# Patient Record
Sex: Male | Born: 1965
Health system: Southern US, Community
[De-identification: ages and names within clinical notes are randomized; demographics above are authoritative.]

## PROBLEM LIST (undated history)

## (undated) DIAGNOSIS — E119 Type 2 diabetes mellitus without complications: Secondary | ICD-10-CM

## (undated) DIAGNOSIS — E785 Hyperlipidemia, unspecified: Secondary | ICD-10-CM

## (undated) HISTORY — PX: CARPAL TUNNEL RELEASE: SHX101

## (undated) HISTORY — DX: Hyperlipidemia, unspecified: E78.5

## (undated) HISTORY — PX: TONSILLECTOMY: SUR1361

---

## 2005-01-15 ENCOUNTER — Encounter: Payer: Self-pay | Admitting: Family Medicine

## 2007-12-30 ENCOUNTER — Encounter: Payer: Self-pay | Admitting: Family Medicine

## 2009-01-12 ENCOUNTER — Encounter: Payer: Self-pay | Admitting: Cardiology

## 2009-01-12 ENCOUNTER — Ambulatory Visit: Payer: Self-pay | Admitting: Family Medicine

## 2009-01-12 DIAGNOSIS — R0789 Other chest pain: Secondary | ICD-10-CM | POA: Insufficient documentation

## 2009-01-12 DIAGNOSIS — E785 Hyperlipidemia, unspecified: Secondary | ICD-10-CM | POA: Insufficient documentation

## 2009-01-18 ENCOUNTER — Ambulatory Visit: Payer: Self-pay | Admitting: Cardiology

## 2009-02-14 ENCOUNTER — Ambulatory Visit: Payer: Self-pay | Admitting: Cardiology

## 2009-02-22 ENCOUNTER — Ambulatory Visit: Payer: Self-pay | Admitting: Family Medicine

## 2009-02-23 LAB — CONVERTED CEMR LAB
ALT: 24 units/L (ref 0–53)
AST: 25 units/L (ref 0–37)
Albumin: 4.1 g/dL (ref 3.5–5.2)
Alkaline Phosphatase: 62 units/L (ref 39–117)
BUN: 16 mg/dL (ref 6–23)
Bilirubin, Direct: 0.1 mg/dL (ref 0.0–0.3)
CO2: 27 meq/L (ref 19–32)
Calcium: 9.3 mg/dL (ref 8.4–10.5)
Chloride: 108 meq/L (ref 96–112)
Cholesterol: 209 mg/dL — ABNORMAL HIGH (ref 0–200)
Creatinine, Ser: 0.9 mg/dL (ref 0.4–1.5)
Direct LDL: 138 mg/dL
GFR calc non Af Amer: 97.15 mL/min (ref >60)
Glucose, Bld: 98 mg/dL (ref 70–99)
HDL: 34.9 mg/dL — ABNORMAL LOW (ref >39.00)
Potassium: 4.4 meq/L (ref 3.5–5.1)
Sodium: 141 meq/L (ref 135–145)
Total Bilirubin: 1.2 mg/dL (ref 0.3–1.2)
Total CHOL/HDL Ratio: 6
Total Protein: 6.7 g/dL (ref 6.0–8.3)
Triglycerides: 212 mg/dL — ABNORMAL HIGH (ref 0.0–149.0)
VLDL: 42.4 mg/dL — ABNORMAL HIGH (ref 0.0–40.0)

## 2010-02-27 ENCOUNTER — Ambulatory Visit: Payer: Self-pay | Admitting: Family Medicine

## 2010-02-27 DIAGNOSIS — N453 Epididymo-orchitis: Secondary | ICD-10-CM | POA: Insufficient documentation

## 2010-03-21 ENCOUNTER — Telehealth: Payer: Self-pay | Admitting: Family Medicine

## 2010-03-21 ENCOUNTER — Ambulatory Visit: Payer: Self-pay | Admitting: Family Medicine

## 2010-03-22 LAB — CONVERTED CEMR LAB
ALT: 21 units/L (ref 0–53)
AST: 20 units/L (ref 0–37)
Albumin: 4.3 g/dL (ref 3.5–5.2)
Alkaline Phosphatase: 55 units/L (ref 39–117)
BUN: 16 mg/dL (ref 6–23)
Basophils Absolute: 0 10*3/uL (ref 0.0–0.1)
Basophils Relative: 0.6 % (ref 0.0–3.0)
Bilirubin, Direct: 0.1 mg/dL (ref 0.0–0.3)
CO2: 25 meq/L (ref 19–32)
Calcium: 9.1 mg/dL (ref 8.4–10.5)
Chloride: 100 meq/L (ref 96–112)
Cholesterol: 209 mg/dL — ABNORMAL HIGH (ref 0–200)
Creatinine, Ser: 0.9 mg/dL (ref 0.4–1.5)
Direct LDL: 141.3 mg/dL
Eosinophils Absolute: 0.1 10*3/uL (ref 0.0–0.7)
Eosinophils Relative: 1.9 % (ref 0.0–5.0)
GFR calc non Af Amer: 100.54 mL/min (ref >60)
Glucose, Bld: 102 mg/dL — ABNORMAL HIGH (ref 70–99)
HCT: 42.8 % (ref 39.0–52.0)
HDL: 38.1 mg/dL — ABNORMAL LOW (ref >39.00)
Hemoglobin: 14.6 g/dL (ref 13.0–17.0)
Lymphocytes Relative: 43.2 % (ref 12.0–46.0)
Lymphs Abs: 2.4 10*3/uL (ref 0.7–4.0)
MCHC: 34.1 g/dL (ref 30.0–36.0)
MCV: 91.7 fL (ref 78.0–100.0)
Monocytes Absolute: 0.5 10*3/uL (ref 0.1–1.0)
Monocytes Relative: 8.6 % (ref 3.0–12.0)
Neutro Abs: 2.6 10*3/uL (ref 1.4–7.7)
Neutrophils Relative %: 45.7 % (ref 43.0–77.0)
PSA: 0.54 ng/mL (ref 0.10–4.00)
Platelets: 232 10*3/uL (ref 150.0–400.0)
Potassium: 4.5 meq/L (ref 3.5–5.1)
RBC: 4.66 M/uL (ref 4.22–5.81)
RDW: 13.4 % (ref 11.5–14.6)
Sodium: 136 meq/L (ref 135–145)
TSH: 1.11 microintl units/mL (ref 0.35–5.50)
Total Bilirubin: 1 mg/dL (ref 0.3–1.2)
Total CHOL/HDL Ratio: 5
Total Protein: 7.3 g/dL (ref 6.0–8.3)
Triglycerides: 252 mg/dL — ABNORMAL HIGH (ref 0.0–149.0)
VLDL: 50.4 mg/dL — ABNORMAL HIGH (ref 0.0–40.0)
WBC: 5.7 10*3/uL (ref 4.5–10.5)

## 2010-04-13 ENCOUNTER — Encounter: Payer: Self-pay | Admitting: Internal Medicine

## 2010-04-17 ENCOUNTER — Telehealth (INDEPENDENT_AMBULATORY_CARE_PROVIDER_SITE_OTHER): Payer: Self-pay

## 2010-04-19 ENCOUNTER — Ambulatory Visit: Payer: Self-pay | Admitting: Internal Medicine

## 2010-04-19 ENCOUNTER — Ambulatory Visit (HOSPITAL_COMMUNITY): Admission: RE | Admit: 2010-04-19 | Discharge: 2010-04-19 | Payer: Self-pay | Admitting: Internal Medicine

## 2010-04-19 HISTORY — PX: COLONOSCOPY: SHX174

## 2010-09-25 NOTE — Assessment & Plan Note (Signed)
Summary: RECHECK MEDS-CHEST DISCOMFORT////CCM   Vital Signs:  Patient profile:   45 year old male Height:      68.5 inches Weight:      242 pounds BMI:     36.39 Temp:     98.1 degrees F oral BP sitting:   90 / 60  (left arm) Cuff size:   regular  Vitals Entered By: Sid Falcon LPN (Jan 12, 2009 3:58 PM) CC: Previous [place] pt, to establish, recheck meds   History of Present Illness: Patient is seen as a work in with chief complaint of intermittent chest discomfort off and on for several months but worse over the past few weeks. He relates recent increased stressors with his father-in-law dying of metastatic lung cancer. He thinks this has been a precipitating factor. He describes  chest discomfort which is mostly left anterior chest which is mild in severity and without radiation sometimes described as a burning quality and sometimes pressure. This is never related to activity. He has not been exercising regularly. Sometimes symptoms last a few minutes sometimes up to 15 minutes. No associated dyspnea or diaphoresis. No prior cardiac problems or any cardiac workup.  Patient has history of hypertriglyceridemia and has been on gemfibrozil for years. He also takes fish oil. No history of smoking.  Preventive Screening-Counseling & Management     Smoking Status: never  Past History:  Family History:    Family History of Alcoholism/Addiction, parent    Colon cancer, rectal, mother    Lung cancer, rather    Family History High cholesterol, both brothers     (01/12/2009)  Social History:    Occupation:  Emergency planning/management officer    Married    Never Smoked    Alcohol use-no     (01/12/2009)  Risk Factors:    Alcohol Use: N/A    >5 drinks/d w/in last 3 months: N/A    Caffeine Use: N/A    Diet: N/A    Exercise: N/A  Risk Factors:    Smoking Status: never (01/12/2009)    Packs/Day: N/A    Cigars/wk: N/A    Pipe Use/wk: N/A    Cans of tobacco/wk: N/A    Passive Smoke Exposure:  N/A  Past Medical History:    Hyperlipidemia    Migraines  Past Surgical History:    Tonsillectomy, child  Family History:    Family History of Alcoholism/Addiction, parent    Colon cancer, rectal, mother    Lung cancer, rather    Family History High cholesterol, both brothers  Social History:    Occupation:  Emergency planning/management officer    Married    Never Smoked    Alcohol use-no    Occupation:  employed    Smoking Status:  never  Review of Systems  The patient denies anorexia, fever, weight loss, weight gain, syncope, dyspnea on exertion, peripheral edema, prolonged cough, and hemoptysis.    Physical Exam  General:  Well-developed,well-nourished,in no acute distress; alert,appropriate and cooperative throughout examination Neck:  No deformities, masses, or tenderness noted. Chest Wall:  nontender to palpation Lungs:  Normal respiratory effort, chest expands symmetrically. Lungs are clear to auscultation, no crackles or wheezes. Heart:  Normal rate and regular rhythm. S1 and S2 normal without gallop, murmur, click, rub or other extra sounds. Extremities:  no edema noted   Impression & Recommendations:  Problem # 1:  CHEST PAIN, ATYPICAL (ICD-786.59) Symptoms are atypical and patient has moderate risk factors.  ? stress induced symptoms.  EKG today  shows sinus bradycardia but no other acute changes. Patient will be referred to cardiology for further evaluation. Add baby aspirin 81 mg daily. He is advised to avoid heavy exertional activity until further evaluated.  Needs to schedule CPE. Orders: EKG w/ Interpretation (93000) Cardiology Referral (Cardiology)  Complete Medication List: 1)  Gemfibrozil 600 Mg Tabs (Gemfibrozil) .... One tab two times a day 2)  Fish Oil 1000 Mg Caps (Omega-3 fatty acids) .... One tab two times a day  Patient Instructions: 1)  We will call you regarding appointment with cardiologist. Avoid any extreme exertional activities until then. Start baby  aspirin one daily. Followup promptly if you have any worsening symptoms. Schedule complete physical examination.

## 2010-09-25 NOTE — Assessment & Plan Note (Signed)
Summary: cpx-will fasting//check prostate//ccm/pt rsc/cjr   Vital Signs:  Patient profile:   45 year old male Weight:      233 pounds Temp:     98.7 degrees F oral Pulse rate:   60 / minute Pulse rhythm:   regular Resp:     12 per minute BP sitting:   90 / 70  (left arm) Cuff size:   large  Vitals Entered By: Sid Falcon LPN (March 21, 2010 9:01 AM) CC: CPX, pt fasting for labs   History of Present Illness: Patient here for complete physical examination.  Past medical history reviewed. History of dyslipidemia. Takes gemfibrozil 600 mg b.i.d. Also takes omega-3 supplement.  testicular pain from last visit fully resolved. Patient has no known drug allergies. Date of last tetanus unknown.  Family history significant for father with lung cancer. Mother had colon cancer, CAD, and type 2 diabetes. Patient has never had colon screening.  Patient is a Emergency planning/management officer. Nonsmoker. No alcohol use.  Allergies (verified): 1)  ! Augmentin (Amoxicillin-Pot Clavulanate)  Past History:  Past Medical History: Last updated: 01/18/2009 Hyperlipidemia Migraines No DM No hypertension  Past Surgical History: Last updated: 01/12/2009 Tonsillectomy, child  Family History: Last updated: 03/21/2010 Family History of Alcoholism/Addiction, parent Colon cancer, rectal, mother Lung cancer, father Family History High cholesterol, both brothers Mother with stents in her 60s Mother Type 2 DM  Social History: Last updated: 01/12/2009 Occupation:  Emergency planning/management officer Married Never Smoked Alcohol use-no  Risk Factors: Smoking Status: never (01/12/2009) PMH-FH-SH reviewed for relevance  Family History: Family History of Alcoholism/Addiction, parent Colon cancer, rectal, mother Lung cancer, father Family History High cholesterol, both brothers Mother with stents in her 67s Mother Type 2 DM  Review of Systems  The patient denies anorexia, fever, weight loss, weight gain, vision loss,  decreased hearing, hoarseness, chest pain, syncope, dyspnea on exertion, peripheral edema, prolonged cough, headaches, hemoptysis, abdominal pain, melena, hematochezia, severe indigestion/heartburn, hematuria, incontinence, genital sores, muscle weakness, suspicious skin lesions, transient blindness, difficulty walking, depression, unusual weight change, abnormal bleeding, enlarged lymph nodes, and testicular masses.    Physical Exam  General:  Well-developed,well-nourished,in no acute distress; alert,appropriate and cooperative throughout examination Head:  Normocephalic and atraumatic without obvious abnormalities. No apparent alopecia or balding. Eyes:  No corneal or conjunctival inflammation noted. EOMI. Perrla. Funduscopic exam benign, without hemorrhages, exudates or papilledema. Vision grossly normal. Ears:  External ear exam shows no significant lesions or deformities.  Otoscopic examination reveals clear canals, tympanic membranes are intact bilaterally without bulging, retraction, inflammation or discharge. Hearing is grossly normal bilaterally. Mouth:  Oral mucosa and oropharynx without lesions or exudates.  Teeth in good repair. Neck:  No deformities, masses, or tenderness noted. Lungs:  Normal respiratory effort, chest expands symmetrically. Lungs are clear to auscultation, no crackles or wheezes. Heart:  Normal rate and regular rhythm. S1 and S2 normal without gallop, murmur, click, rub or other extra sounds. Abdomen:  Bowel sounds positive,abdomen soft and non-tender without masses, organomegaly or hernias noted. Rectal:  No external abnormalities noted. Normal sphincter tone. No rectal masses or tenderness. Prostate:  Prostate gland firm and smooth, no enlargement, nodularity, tenderness, mass, asymmetry or induration. Msk:  No deformity or scoliosis noted of thoracic or lumbar spine.   Extremities:  No clubbing, cyanosis, edema, or deformity noted with normal full range of motion of  all joints.   Neurologic:  alert & oriented X3, cranial nerves II-XII intact, strength normal in all extremities, and gait normal.  Skin:  no rashes and no suspicious lesions.   Cervical Nodes:  No lymphadenopathy noted Psych:  Cognition and judgment appear intact. Alert and cooperative with normal attention span and concentration. No apparent delusions, illusions, hallucinations   Impression & Recommendations:  Problem # 1:  Preventive Health Care (ICD-V70.0) patient needs colonoscopy screening given family history of colon cancer in his mother. Tetanus booster will be given. Obtain screening labs. Work on weight loss and more consistent exercise  Complete Medication List: 1)  Gemfibrozil 600 Mg Tabs (Gemfibrozil) .... One tab two times a day 2)  Fish Oil 1000 Mg Caps (Omega-3 fatty acids) .... One tab two times a day 3)  Ciprofloxacin Hcl 500 Mg Tabs (Ciprofloxacin hcl) .... One by mouth two times a day for 10 days  Other Orders: TLB-Lipid Panel (80061-LIPID) TLB-BMP (Basic Metabolic Panel-BMET) (80048-METABOL) TLB-CBC Platelet - w/Differential (85025-CBCD) TLB-Hepatic/Liver Function Pnl (80076-HEPATIC) TLB-TSH (Thyroid Stimulating Hormone) (84443-TSH) TLB-PSA (Prostate Specific Antigen) (84153-PSA) Gastroenterology Referral (GI) Tdap => 22yrs IM (16109) Admin 1st Vaccine (60454) Venipuncture (09811) Specimen Handling (91478)  Patient Instructions: 1)  It is important that you exercise reguarly at least 20 minutes 5 times a week. If you develop chest pain, have severe difficulty breathing, or feel very tired, stop exercising immediately and seek medical attention.  2)  You need to lose weight. Consider a lower calorie diet and regular exercise.  Prescriptions: GEMFIBROZIL 600 MG TABS (GEMFIBROZIL) one tab two times a day  #60 x 11   Entered and Authorized by:   Evelena Peat MD   Signed by:   Evelena Peat MD on 03/21/2010   Method used:   [facility]        [facility] [phone][facility] (retail)       [address]       [place], [place]  [address]       Ph: [phone] or [phone]       Fax: [phone]   RxID:   [id number]     Immunizations Administered:  Tetanus Vaccine:    Vaccine Type: Tdap    Site: left deltoid    Mfr: GlaxoSmithKline    Dose: 0.5 ml    Route: IM    Given by: Sid Falcon LPN    Exp. Date: 09/14/2011    Lot #: [id number]

## 2010-09-25 NOTE — Progress Notes (Signed)
Summary: Pt requesting #180 of Gemfibrozil  Phone Note Call from Patient Call back at Home Phone [phone]   Caller: Patient---live call Summary of Call: pt would like for Harriett Sine to return call. No further message was given. Initial call taken by: Warnell Forester,  March 21, 2010 1:05 PM  Follow-up for Phone Call        [facility] on home phone Sid Falcon LPN  March 21, 2010 1:21 PM   Additional Follow-up for Phone Call Additional follow up Details #1::        Pt requesting #180 of med filled earlier today, 3 month supply at a time.  Called pharmacy and authorized 180, pt informed Additional Follow-up by: Sid Falcon LPN,  March 21, 2010 3:29 PM

## 2010-09-25 NOTE — Progress Notes (Signed)
Summary: phone note/advise pt in time change of procedure  Phone Note Outgoing Call   Call placed by: Numair Masden Call placed to: Patient Summary of Call: [facility] that time for procedure has changed due to adding OR pt's. Original time was 11:00 AM. It is now 1:15 pm and he  needs to be there at 12:15 pm. Ask him to please call and confirm that he received the message. Initial call taken by: Cloria Spring LPN,  April 17, 2010 10:59 AM     Appended Document: phone note/advise pt in time change of procedure Pt informed.

## 2010-09-25 NOTE — Assessment & Plan Note (Signed)
Summary: testicle pain/njr   Vital Signs:  Patient profile:   45 year old male Height:      69 inches (175.26 cm) Weight:      237 pounds (107.73 kg) BMI:     35.13 O2 Sat:      97 % on Room air Temp:     98.4 degrees F (36.89 degrees C) oral Pulse rate:   69 / minute BP sitting:   108 / 72  (left arm) Cuff size:   large  Vitals Entered By: Josph Macho RMA (February 27, 2010 11:03 AM)  O2 Flow:  Room air CC: Testicle pain X6 days/ CF Is Patient Diabetic? No   History of Present Illness: 6 days hx of L sided testicle pain.  Burning pain with some nausea.  Moderate intensity. No injury.  No fever.  No urinary symptoms.  Progressively worse over time. Has not noted any masses.  No exacerbating factors.  Slightly better with heat.  Current Medications (verified): 1)  Gemfibrozil 600 Mg Tabs (Gemfibrozil) .... One Tab Two Times A Day 2)  Fish Oil 1000 Mg Caps (Omega-3 Fatty Acids) .... One Tab Two Times A Day  Allergies (verified): No Known Drug Allergies  Past History:  Past Medical History: Last updated: 01/18/2009 Hyperlipidemia Migraines No DM No hypertension  Review of Systems  The patient denies anorexia, fever, weight loss, abdominal pain, hematuria, and incontinence.    Physical Exam  General:  Well-developed,well-nourished,in no acute distress; alert,appropriate and cooperative throughout examination Lungs:  Normal respiratory effort, chest expands symmetrically. Lungs are clear to auscultation, no crackles or wheezes. Heart:  Normal rate and regular rhythm. S1 and S2 normal without gallop, murmur, click, rub or other extra sounds. Genitalia:  no hernia.  Tender over L epididymis gland.  No testicle mass.  No other scrotal masses noted.   Impression & Recommendations:  Problem # 1:  EPIDIDYMITIS (ICD-604.90) Assessment New start Cipro and pt has CPE in 3 weeks scheduled.  Complete Medication List: 1)  Gemfibrozil 600 Mg Tabs (Gemfibrozil) .... One tab  two times a day 2)  Fish Oil 1000 Mg Caps (Omega-3 fatty acids) .... One tab two times a day 3)  Ciprofloxacin Hcl 500 Mg Tabs (Ciprofloxacin hcl) .... One by mouth two times a day for 10 days  Patient Instructions: 1)  Continue Advil or Alleve as needed. 2)  Be in touch if pain not resolving in 2 weeks. Prescriptions: CIPROFLOXACIN HCL 500 MG TABS (CIPROFLOXACIN HCL) one by mouth two times a day for 10 days  #20 x 0   Entered and Authorized by:   Evelena Peat MD   Signed by:   Evelena Peat MD on 02/27/2010   Method used:   Electronically to        [facility] [phone]* (retail)       [address]       [place], [place]  [address]       Ph: [phone] or [phone]       Fax: [phone]   RxID:   [id number]

## 2010-09-25 NOTE — Letter (Signed)
Summary: Internal Other Domingo Dimes  Internal Other Domingo Dimes   Imported By: Cloria Spring LPN 16/05/9603 54:09:81  _____________________________________________________________________  External Attachment:    Type:   Image     Comment:   External Document

## 2010-09-25 NOTE — Assessment & Plan Note (Signed)
Summary: np6/atypical chest pain/kfw   CC:  pt complains of chest pain .  History of Present Illness: 45 yo male with no prior cardiac history who I am asked to evaluate for chest pain. The patient states that for the past one year he has had occasional chest pain. It is in the left upper chest area. It lasts for seconds. It is unpredictable. It is not pleuritic or positional nor is it related to food. It is not exertional. It is not associated with nausea, short of breath or diaphoresis. Note he does not have dyspnea on exertion, orthopnea, PND, pedal edema, palpitations, syncope or exertional chest pain. The patient is a Emergency planning/management officer.  Current Medications (verified): 1)  Gemfibrozil 600 Mg Tabs (Gemfibrozil) .... One Tab Two Times A Day 2)  Fish Oil 1000 Mg Caps (Omega-3 Fatty Acids) .... One Tab Two Times A Day  Past History:  Past Medical History: Hyperlipidemia Migraines No DM No hypertension  Past Surgical History: Reviewed history from 01/12/2009 and no changes required. Tonsillectomy, child  Family History: Reviewed history from 01/12/2009 and no changes required. Family History of Alcoholism/Addiction, parent Colon cancer, rectal, mother Lung cancer, father Family History High cholesterol, both brothers Mother with stents in her 94s  Social History: Reviewed history from 01/12/2009 and no changes required. Occupation:  Emergency planning/management officer Married Never Smoked Alcohol use-no  Review of Systems       no fevers or chills, productive cough, hemoptysis, dysphasia, odynophagia, melena, hematochezia, dysuria, hematuria, rash, seizure activity, orthopnea, PND, pedal edema, claudication. Remaining systems are negative.   Vital Signs:  Patient profile:   45 year old male Height:      69 inches Weight:      238 pounds Pulse rate:   72 / minute BP sitting:   101 / 74  (left arm)  Vitals Entered By: Kem Parkinson (Jan 18, 2009 2:35 PM)  Physical Exam  General:   Well developed/well nourished in NAD Skin warm/dry Patient not depressed No peripheral clubbing Back-normal HEENT-normal/normal eyelids Neck supple/normal carotid upstroke bilaterally; no bruits; no JVD; no thyromegaly chest - CTA/ normal expansion CV - RRR/normal S1 and S2; no murmurs, rubs or gallops; no rub; PMI nondisplaced Abdomen -NT/ND, no HSM, no mass, + bowel sounds, no bruit 2+ femoral pulses, no bruits Ext-no edema, chords, 2+ DP Neuro-grossly nonfocal     EKG  Procedure date:  01/12/2009  Findings:      Sinus rhythm at a rate of 51. No ST changes.  Impression & Recommendations:  Problem # 1:  CHEST PAIN, ATYPICAL (ICD-786.59) Symptoms are extremely atypical. However he is concerned about these. He also is a Emergency planning/management officer and needs risk stratification. We will schedule an exercise treadmill. Orders: Treadmill (Treadmill)  Problem # 2:  HYPERLIPIDEMIA (ICD-272.4) Management per primary care. His updated medication list for this problem includes:    Gemfibrozil 600 Mg Tabs (Gemfibrozil) ..... One tab two times a day  Patient Instructions: 1)  Your physician recommends that you schedule a follow-up appointment in: TREADMILL 2)  Your physician has requested that you have an exercise tolerance test.  For further information please visit [email or web address]  Please also follow instruction sheet, as given.

## 2010-09-25 NOTE — Assessment & Plan Note (Signed)
Summary: med check/mhf   Vital Signs:  Patient profile:   45 year old male Weight:      237 pounds Temp:     97.9 degrees F oral Pulse rate:   60 / minute Pulse rhythm:   regular BP sitting:   110 / 70  Vitals Entered By: Sid Falcon LPN (February 22, 2009 10:03 AM) CC: Pt fasting for labs, one month F/U   History of Present Illness: Patient seen for followup dyslipidemia. Recent atypical chest pain an exercise tolerance test was normal. He had no further chest pain since then. Patient has been on gemfibrozil 600 mg b.i.d. for several years. He had intolerance to TriCor with severe myalgias of lower extremities. He has been compliant with medication. He also takes omega-3 2000 mg daily. Patient exercises though somewhat inconsistently. Family history significant for mother with type 2 diabetes and sister passed away from constipation as a probable type 1 diabetes.  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 Lungs:  Normal respiratory effort, chest expands symmetrically. Lungs are clear to auscultation, no crackles or wheezes. Heart:  Normal rate and regular rhythm. S1 and S2 normal without gallop, murmur, click, rub or other extra sounds.   Impression & Recommendations:  Problem # 1:  HYPERLIPIDEMIA (ICD-272.4)  patient has predominantly hypertriglyceridemia.  Continue weight loss efforts and recheck lipids today along with hepatic panel. Refill gemfibrozil for one year. He needs to get at least yearly monitoring of blood sugar given significant family history His updated medication list for this problem includes:    Gemfibrozil 600 Mg Tabs (Gemfibrozil) ..... One tab two times a day  Orders: Venipuncture (91478) TLB-Lipid Panel (80061-LIPID) TLB-Hepatic/Liver Function Pnl (80076-HEPATIC) TLB-BMP (Basic Metabolic Panel-BMET) (80048-METABOL)  Complete Medication List: 1)  Gemfibrozil 600 Mg Tabs (Gemfibrozil) .... One tab two times a day 2)  Fish Oil 1000 Mg Caps (Omega-3 fatty acids) .... One tab two times a day  Patient Instructions: 1)  It is important that you exercise reguarly at least 20 minutes 5 times a week. If you develop chest pain, have severe difficulty breathing, or feel very tired, stop exercising immediately and seek medical attention.  Prescriptions: GEMFIBROZIL 600 MG TABS (GEMFIBROZIL) one tab two times a day  #180 x 3   Entered and Authorized by:   Evelena Peat MD   Signed by:   Evelena Peat MD on 02/22/2009   Method used:   [facility] to        [facility] [phone]* (retail)       [address]       [place], [place]  [address]       Ph: [phone] or [phone]       Fax: [phone]   RxID:   [id number]

## 2010-09-25 NOTE — Assessment & Plan Note (Signed)
Summary: GXT WITH Andrew Shields/786.59/SAF   Exercise Tolerance Test Cardiovascular Risk History:      Positive major cardiovascular risk factors include hyperlipidemia.  Negative major cardiovascular risk factors include male age < 47 years old, no history of diabetes, no history of hypertension, negative family history for ischemic heart disease, and non-tobacco-user status.        Further assessment for target organ damage reveals no history of ASHD, cardiac end-organ damage (CHF/LVH), stroke/TIA, peripheral vascular disease, renal insufficiency, or hypertensive retinopathy.    Baseline EKG:    Rhythm:     normal sinus    Rate:       62    PR:       .17    QRS:       .08    QT:       .41    QTc:       .41  Exercise Tolerance Test Results:    Ordering MD:        Dr. Olga Millers    Interpreting MD:     Dr. Olga Millers    Indication for ETT:     chest pain-rule out ischemia    Stress Modality:     exercise-treadmill    Cardiac Imaging Performed:   none    Protocol:       Standard Bruce-maximal    Maximum BP:        142 / 58    MPHR (bpm):        176    85% MPHR (bpm):     150    MHR obtained (bpm):        160    Reached 85% MPHR       (min:sec):       8:10    Total Exercise Time       (min:sec):       10:00    Workload in METS:     11.9    Borg Scale:       13    ST Segment analysis:       At Rest:       normal ST segments-no evidence of significant ST depression       With Exercise:     no evidence of significant ST depression    Arrhythmia:             no    Angina during ETT:     absent (0)  Cardiovascular Risk Assessment/Plan:      The patient's hypertensive risk group is category B: At least one risk factor (excluding diabetes) with no target organ damage.  Today's blood pressure is 102/79.  His blood pressure goal is < 140/90.  Exercise Tolerance Test Assessment:    Quality of ETT:   diagnostic    ETT Interpretation:   normal-no evidence of ischemia by ST analysis   Comments:     ETT with no chest pain or ECG changes; negative adequate ETT.

## 2010-10-03 ENCOUNTER — Ambulatory Visit (HOSPITAL_BASED_OUTPATIENT_CLINIC_OR_DEPARTMENT_OTHER)
Admission: RE | Admit: 2010-10-03 | Discharge: 2010-10-03 | Disposition: A | Discharge status: Home / Self Care | Payer: Worker's Compensation | Attending: Orthopedic Surgery | Admitting: Orthopedic Surgery

## 2010-10-03 DIAGNOSIS — G5600 Carpal tunnel syndrome, unspecified upper limb: Secondary | ICD-10-CM | POA: Insufficient documentation

## 2010-10-03 DIAGNOSIS — Z01812 Encounter for preprocedural laboratory examination: Secondary | ICD-10-CM | POA: Insufficient documentation

## 2010-10-03 DIAGNOSIS — M65839 Other synovitis and tenosynovitis, unspecified forearm: Secondary | ICD-10-CM | POA: Insufficient documentation

## 2010-10-03 LAB — POCT HEMOGLOBIN-HEMACUE: Hemoglobin: 15.8 g/dL (ref 13.0–17.0)

## 2010-10-15 NOTE — Op Note (Signed)
Andrew Shields, Andrew Shields              ACCOUNT NO.:  [id number]  MEDICAL RECORD NO.:  [id number]           PATIENT TYPE:  LOCATION:                                 FACILITY:  PHYSICIAN:  Cindee Salt, M.D.       DATE OF BIRTH:  Jun 27, 1966  DATE OF PROCEDURE:  10/03/2010 DATE OF DISCHARGE:                              OPERATIVE REPORT   PREOPERATIVE DIAGNOSES:  Carpal tunnel syndrome, right hand; stenosing tenosynovitis, right middle finger.  POSTOPERATIVE DIAGNOSES:  Carpal tunnel syndrome, right hand; stenosing tenosynovitis, right middle finger.  OPERATION:  Release A1 pulley, right middle finger; carpal tunnel release, right hand.  SURGEON:  Cindee Salt, MD.  ANESTHESIA:  General with local infiltration.  ANESTHESIOLOGIST:  Janetta Hora. Gelene Mink, MD.  HISTORY:  The patient is a 45 year old male with a history of carpal tunnel syndrome, EMG nerve conductions positive.  This has not responded to conservative treatment.  He is elected to undergo surgical decompression.  Pre, peri, and postoperative course have been discussed along with risks and complications.  He is aware that there is no guarantee with the surgery, possibility of infection, recurrence of injury to arteries, nerves, and tendons,complete relief of symptoms, and dystrophy.  In the preoperative area, the patient is seen.  The extremity was marked by both the patient and surgeon.  Antibiotic given.  PROCEDURE:  The patient was brought to the operating room where general anesthetic was carried out without difficulty, was prepped using ChloraPrep, supine position, with a right arm free.  A 3-minute dry time was allowed, time-out taken, confirming the patient and procedure.  The limb was exsanguinated with an Esmarch bandage.  Tourniquet placed high and the arm was inflated to 220 mmHg.  Straight incision was made longitudinally and the palm carried down through subcutaneous tissue. Bleeders were electrocauterized.   Palmar fascia was split.  Superficial palmar arch identified.  The flexor tendon to the ring little finger identified to the ulnar side of the median nerve.  Carpal retinaculum was incised with sharp dissection.  A right-angle and Sewall retractor were placed between skin and forearm fascia.  The fascia released for approximately a centimeter and a half proximal to the wrist crease under direct vision.  Canal was explored.  Air compression to the nerve was apparent.  No further lesions were identified.  The wound was irrigated. Skin closed with interrupted 5-0 Vicryl Rapide sutures.  Separate incision was then made over the A1 pulley of the right middle finger carried down through subcutaneous tissue.  Bleeders again electrocauterized with bipolar.  The neurovascular bundles were identified and retracted proximally and ulnarly.  The A1 pulley was then identified on its radial aspect and incision was made.  A small incision made centrally in the A2 pulley.  The finger placed through a full range motion.  No further triggering was noted.  The wound was copiously irrigated with saline.  The skin closed with interrupted 5-0 Vicryl Rapide sutures.  Local infiltration was given.  A 0.25% Marcaine without epinephrine approximately 7 mL total was given.  The patient tolerated the procedure well.  A sterile compressive  dressing and splint to the wrist with the fingers free was applied. Deflation of the tourniquet, all fingers immediately pinked.  He was taken to the recovery room for observation in satisfactory condition. He will be discharged home to return to the [facility] of [place] in 1 week, on Vicodin.    ______________________________ Cindee Salt, M.D.   ______________________________ Cindee Salt, M.D.    GK/MEDQ  D:  10/03/2010  T:  10/04/2010  Job:  [id number]  Electronically Signed by Cindee Salt M.D. on 10/15/2010 02:25:38 PM

## 2010-12-10 ENCOUNTER — Encounter: Payer: Self-pay | Admitting: Family Medicine

## 2010-12-10 ENCOUNTER — Ambulatory Visit (INDEPENDENT_AMBULATORY_CARE_PROVIDER_SITE_OTHER): Payer: Worker's Compensation | Admitting: Family Medicine

## 2010-12-10 ENCOUNTER — Ambulatory Visit (INDEPENDENT_AMBULATORY_CARE_PROVIDER_SITE_OTHER)
Admission: RE | Admit: 2010-12-10 | Discharge: 2010-12-10 | Disposition: A | Discharge status: Home / Self Care | Payer: PRIVATE HEALTH INSURANCE | Source: Ambulatory Visit | Attending: Family Medicine | Admitting: Family Medicine

## 2010-12-10 DIAGNOSIS — M545 Low back pain, unspecified: Secondary | ICD-10-CM

## 2010-12-10 DIAGNOSIS — M25579 Pain in unspecified ankle and joints of unspecified foot: Secondary | ICD-10-CM

## 2010-12-10 MED ORDER — HYDROCODONE-ACETAMINOPHEN 5-325 MG PO TABS: 2.0000 | ORAL_TABLET | Freq: Four times a day (QID) | ORAL | Status: AC | PRN

## 2010-12-10 NOTE — Progress Notes (Signed)
Addended by: Trinna Post on: 12/10/2010 06:31 PM   Modules accepted: Orders

## 2010-12-10 NOTE — Progress Notes (Addendum)
  Subjective:    Patient ID: Andrew Shields, male    DOB: 24-Mar-1966, 45 y.o.   MRN: [id number]  HPI Status post fall from ladder this past Saturday. He is not sure exactly how he landed but he does recall hyperextending his back. Now has left ankle pain and swelling which is somewhat poorly localized and sharp pain exacerbated by deep breathing lower lumbar and sacral area. No visible bruising of the back region.  No radiculopathy symptoms.  No lower extremity weakness.  Back pain as sharp without radiation. Leftover hydrocodone helped. Minimal relief with Advil. He did apply some ice to left ankle. Some pain with ambulation but was able to ambulate immediately afterwards. No head injury. No dizziness or confusion.   Review of Systems  HENT: Negative for neck pain.   Respiratory: Negative for cough and shortness of breath.   Cardiovascular: Negative for chest pain and palpitations.  Gastrointestinal: Negative for abdominal pain and blood in stool.  Genitourinary: Negative for dysuria, hematuria and flank pain.  Musculoskeletal: Positive for back pain. Negative for myalgias.  Psychiatric/Behavioral: Negative for confusion.       Objective:   Physical Exam  Constitutional: He is oriented to person, place, and time. He appears well-developed and well-nourished. No distress.  Cardiovascular: Normal rate, regular rhythm and normal heart sounds.   Pulmonary/Chest: Effort normal and breath sounds normal. No respiratory distress. He has no wheezes. He has no rales.  Musculoskeletal:       Left ankle reveals some diffuse edema. Ecchymosis lateral and medial aspect of the foot. He has tenderness both lateral and medial aspect of ankle no navicular tenderness. Pain with extreme dorsiflexion and plantarflexion. No fibular tenderness  Lower back exam reveals no ecchymosis or swelling. Tender lower lumbar area.  Straight leg raises negative  Neurological: He is alert and oriented to person, place,  and time. He has normal reflexes. No cranial nerve deficit.  Psychiatric: He has a normal mood and affect.          Assessment & Plan:  #1 left ankle pain following fall. X-ray to rule out fracture #2 low back pain following fall. Given history of trauma lumbosacral spine films. If negative continue anti-inflammatory and prescription for hydrocodone #40 tablets one to 2 every 6 hours as needed  Ankle x-rays were negative. Lumbosacral spine films reveal irregularity anterior superior aspect of L1 and L2 concerning for possible fracture. May need MRI to further clarify. We'll schedule followup with neurosurgeon though we do not dissipate this will be become unstable type fracture and he has no focal neurologic symptoms

## 2010-12-11 ENCOUNTER — Other Ambulatory Visit: Payer: Self-pay | Admitting: Family Medicine

## 2010-12-11 DIAGNOSIS — M549 Dorsalgia, unspecified: Secondary | ICD-10-CM

## 2010-12-11 NOTE — Progress Notes (Signed)
Addended by: Trinna Post on: 12/11/2010 07:39 PM   Modules accepted: Orders

## 2010-12-12 ENCOUNTER — Ambulatory Visit
Admission: RE | Admit: 2010-12-12 | Discharge: 2010-12-12 | Disposition: A | Discharge status: Home / Self Care | Payer: PRIVATE HEALTH INSURANCE | Source: Ambulatory Visit | Attending: Family Medicine | Admitting: Family Medicine

## 2010-12-12 DIAGNOSIS — M545 Low back pain, unspecified: Secondary | ICD-10-CM

## 2010-12-12 NOTE — Progress Notes (Signed)
Quick Note:  Pt informed and he voiced his understanding ______ 

## 2010-12-13 ENCOUNTER — Telehealth: Payer: Self-pay | Admitting: Family Medicine

## 2010-12-13 NOTE — Telephone Encounter (Signed)
Pt has made an appt for next Wednesday,  to follow up on back pain. However, he would like to know if it ok to mow his yard this weekend? Also, his employer wants to know when can he return to work?

## 2010-12-16 NOTE — Telephone Encounter (Signed)
I would advise very light physical activity for now with no lifting or bending at waist.  He could return to work in 1-2 weeks but I want to reassess first.  He will have to return to light duty which I will discuss with him at follow up.

## 2010-12-19 ENCOUNTER — Ambulatory Visit (INDEPENDENT_AMBULATORY_CARE_PROVIDER_SITE_OTHER): Payer: PRIVATE HEALTH INSURANCE | Admitting: Family Medicine

## 2010-12-19 ENCOUNTER — Encounter: Payer: Self-pay | Admitting: Family Medicine

## 2010-12-19 VITALS — BP 100/78 | Temp 98.8°F

## 2010-12-19 DIAGNOSIS — S32009A Unspecified fracture of unspecified lumbar vertebra, initial encounter for closed fracture: Secondary | ICD-10-CM

## 2010-12-19 NOTE — Progress Notes (Signed)
  Subjective:    Patient ID: Andrew Shields, male    DOB: November 01, 1965, 45 y.o.   MRN: [id number]  HPI Patient seen for followup recent fall. He fell off a ladder with injury to low back. X-rays reveal probable fracture anterior endplate of L1 and L2. This was confirmed by MRI scan. No concern for pathologic fracture. Patient is significantly improved at this time. Rarely taking hydrocodone. Still occasional night pain. Overall greatly improved. No weakness or numbness. Left ankle is greatly improved as well. Swelling hasresolved. No pain with weightbearing. He has remained out of work since his injury.   Review of Systems  Respiratory: Negative for chest tightness.   Genitourinary: Negative for dysuria.  Musculoskeletal: Negative for arthralgias.  Neurological: Negative for dizziness, weakness, light-headedness and headaches.       Objective:   Physical Exam  Constitutional: He appears well-developed and well-nourished.  HENT:  Head: Normocephalic and atraumatic.  Cardiovascular: Normal rate and regular rhythm.   Pulmonary/Chest: Effort normal and breath sounds normal. No respiratory distress. He has no wheezes.  Musculoskeletal: He exhibits no edema and no tenderness.       Back reveals good range of motion. No spinal tenderness.  Neurological:       No weakness lower extremities. Symmetric reflexes          Assessment & Plan:  Fracture or L1 and L2 anterior endplate with no complications. Clinically improving. No bending or lifting for the next 2-3 weeks then gradually resume activities as tolerated

## 2010-12-19 NOTE — Patient Instructions (Signed)
No lifting over 20 pounds for the next 2-3 weeks.

## 2011-06-24 ENCOUNTER — Ambulatory Visit (INDEPENDENT_AMBULATORY_CARE_PROVIDER_SITE_OTHER): Payer: PRIVATE HEALTH INSURANCE | Admitting: Family Medicine

## 2011-06-24 ENCOUNTER — Encounter: Payer: Self-pay | Admitting: Family Medicine

## 2011-06-24 VITALS — BP 100/68 | Temp 98.3°F | Wt 232.0 lb

## 2011-06-24 DIAGNOSIS — E785 Hyperlipidemia, unspecified: Secondary | ICD-10-CM

## 2011-06-24 DIAGNOSIS — R42 Dizziness and giddiness: Secondary | ICD-10-CM

## 2011-06-24 LAB — BASIC METABOLIC PANEL
BUN: 13 mg/dL (ref 6–23)
CO2: 25 mEq/L (ref 19–32)
Calcium: 9.1 mg/dL (ref 8.4–10.5)
Chloride: 104 mEq/L (ref 96–112)
Creatinine, Ser: 0.9 mg/dL (ref 0.4–1.5)
GFR: 96.15 mL/min (ref >60.00)
Glucose, Bld: 82 mg/dL (ref 70–99)
Potassium: 4.2 mEq/L (ref 3.5–5.1)
Sodium: 138 mEq/L (ref 135–145)

## 2011-06-24 LAB — HEPATIC FUNCTION PANEL
ALT: 22 U/L (ref 0–53)
AST: 17 U/L (ref 0–37)
Albumin: 4.1 g/dL (ref 3.5–5.2)
Alkaline Phosphatase: 57 U/L (ref 39–117)
Bilirubin, Direct: 0 mg/dL (ref 0.0–0.3)
Total Bilirubin: 0.6 mg/dL (ref 0.3–1.2)
Total Protein: 7.2 g/dL (ref 6.0–8.3)

## 2011-06-24 LAB — LIPID PANEL
Cholesterol: 208 mg/dL — ABNORMAL HIGH (ref 0–200)
HDL: 35.7 mg/dL — ABNORMAL LOW (ref >39.00)
Total CHOL/HDL Ratio: 6
Triglycerides: 292 mg/dL — ABNORMAL HIGH (ref 0.0–149.0)
VLDL: 58.4 mg/dL — ABNORMAL HIGH (ref 0.0–40.0)

## 2011-06-24 LAB — GLUCOSE, POCT (MANUAL RESULT ENTRY): POC Glucose: 91

## 2011-06-24 LAB — LDL CHOLESTEROL, DIRECT: Direct LDL: 142.2 mg/dL

## 2011-06-24 MED ORDER — GEMFIBROZIL 600 MG PO TABS: 600.0000 mg | ORAL_TABLET | Freq: Two times a day (BID) | ORAL | Status: DC

## 2011-06-24 NOTE — Patient Instructions (Signed)
Drink more fluids and liberalize salt intake somewhat

## 2011-06-24 NOTE — Progress Notes (Signed)
  Subjective:    Patient ID: Andrew Shields, male    DOB: 15-Apr-1966, 45 y.o.   MRN: [id number]  HPI  Medical follow. Long history of dyslipidemia. Patient has previously been intolerant of statins and also intolerant of fenofibrate. He's been on Lopid for several years. Takes twice daily. No side effects.  Recent issues with some dizziness. Occasionally with standing. Symptoms are inconsistent. No syncope. Denies vertigo.  Patient denies any history of N,V, diarrhea. No exertional chest pain. Drinks a fair amount of caffeine. No history of anemia. He does not take any medications other than Lopid and omega-3 supplement  Past Medical History  Diagnosis Date  . Hyperlipidemia    Past Surgical History  Procedure Date  . Tonsillectomy     reports that he has never smoked. He does not have any smokeless tobacco history on file. His alcohol and drug histories not on file. family history includes Alcohol abuse in his father; Cancer in his father and mother; Diabetes in his mother; Heart disease (age of onset:60) in his mother; and Hyperlipidemia in his brother. Allergies  Allergen Reactions  . NWG:NFAOZHYQMVH+QIONGEXBM+WUXLKGMWNU Acid+Aspartame     REACTION: difficulty breathing      Review of Systems  Constitutional: Negative for fever, chills, appetite change and unexpected weight change.  Respiratory: Negative for cough and shortness of breath.   Cardiovascular: Negative for chest pain, palpitations and leg swelling.  Gastrointestinal: Negative for abdominal pain.  Genitourinary: Negative for dysuria.  Neurological: Positive for dizziness. Negative for syncope, weakness and headaches.       Objective:   Physical Exam  Constitutional: He is oriented to person, place, and time. He appears well-developed and well-nourished.  Neck: Neck supple. No thyromegaly present.  Cardiovascular: Normal rate, regular rhythm and normal heart sounds.   No murmur heard. Pulmonary/Chest: Effort  normal and breath sounds normal. No respiratory distress. He has no wheezes. He has no rales.  Musculoskeletal: He exhibits no edema.  Neurological: He is alert and oriented to person, place, and time. No cranial nerve deficit.  Skin: No rash noted.  Psychiatric: He has a normal mood and affect. His behavior is normal.          Assessment & Plan:  #1 dyslipidemia. Refill Lopid. Check lipid and hepatic panel #2 dizziness. By description this sounds like possible relative low blood pressure. No orthostatic change today. Liberalize sodium intake somewhat and increase non-caffeinated beverages.

## 2011-06-25 NOTE — Progress Notes (Signed)
Quick Note:  Pt informed on home VM ______ 

## 2012-06-24 ENCOUNTER — Other Ambulatory Visit: Payer: Self-pay | Admitting: Family Medicine

## 2012-06-30 ENCOUNTER — Other Ambulatory Visit: Payer: Self-pay | Admitting: *Deleted

## 2012-06-30 NOTE — Telephone Encounter (Signed)
Request to fill 180 denied Gemfibrozil, pt needs OV

## 2012-08-15 ENCOUNTER — Other Ambulatory Visit: Payer: Self-pay | Admitting: Family Medicine

## 2012-08-28 ENCOUNTER — Ambulatory Visit (INDEPENDENT_AMBULATORY_CARE_PROVIDER_SITE_OTHER): Payer: PRIVATE HEALTH INSURANCE | Admitting: Family Medicine

## 2012-08-28 ENCOUNTER — Other Ambulatory Visit (INDEPENDENT_AMBULATORY_CARE_PROVIDER_SITE_OTHER): Payer: PRIVATE HEALTH INSURANCE | Admitting: Family Medicine

## 2012-08-28 VITALS — BP 100/68 | HR 68 | Temp 97.7°F

## 2012-08-28 VITALS — BP 100/68 | Temp 97.8°F

## 2012-08-28 DIAGNOSIS — Z0000 Encounter for general adult medical examination without abnormal findings: Secondary | ICD-10-CM

## 2012-08-28 DIAGNOSIS — J019 Acute sinusitis, unspecified: Secondary | ICD-10-CM

## 2012-08-28 LAB — CBC WITH DIFFERENTIAL/PLATELET
Basophils Absolute: 0 10*3/uL (ref 0.0–0.1)
Basophils Relative: 0.3 % (ref 0.0–3.0)
Eosinophils Absolute: 0.1 10*3/uL (ref 0.0–0.7)
Eosinophils Relative: 1.3 % (ref 0.0–5.0)
HCT: 42.3 % (ref 39.0–52.0)
Hemoglobin: 14.1 g/dL (ref 13.0–17.0)
Lymphocytes Relative: 31.4 % (ref 12.0–46.0)
Lymphs Abs: 3.1 10*3/uL (ref 0.7–4.0)
MCHC: 33.3 g/dL (ref 30.0–36.0)
MCV: 90.9 fl (ref 78.0–100.0)
Monocytes Absolute: 0.8 10*3/uL (ref 0.1–1.0)
Monocytes Relative: 8 % (ref 3.0–12.0)
Neutro Abs: 5.7 10*3/uL (ref 1.4–7.7)
Neutrophils Relative %: 59 % (ref 43.0–77.0)
Platelets: 227 10*3/uL (ref 150.0–400.0)
RBC: 4.65 Mil/uL (ref 4.22–5.81)
RDW: 13.8 % (ref 11.5–14.6)
WBC: 9.7 10*3/uL (ref 4.5–10.5)

## 2012-08-28 LAB — POCT URINALYSIS DIPSTICK
Bilirubin, UA: NEGATIVE
Glucose, UA: NEGATIVE
Ketones, UA: NEGATIVE
Leukocytes, UA: NEGATIVE
Nitrite, UA: NEGATIVE
Protein, UA: NEGATIVE
Spec Grav, UA: >=1.03
Urobilinogen, UA: 0.2
pH, UA: 6

## 2012-08-28 LAB — LIPID PANEL
Cholesterol: 192 mg/dL (ref 0–200)
HDL: 36.9 mg/dL — ABNORMAL LOW
LDL Cholesterol: 128 mg/dL — ABNORMAL HIGH (ref 0–99)
Total CHOL/HDL Ratio: 5
Triglycerides: 138 mg/dL (ref 0.0–149.0)
VLDL: 27.6 mg/dL (ref 0.0–40.0)

## 2012-08-28 LAB — TSH: TSH: 0.63 u[IU]/mL (ref 0.35–5.50)

## 2012-08-28 LAB — HEPATIC FUNCTION PANEL
ALT: 17 U/L (ref 0–53)
AST: 20 U/L (ref 0–37)
Albumin: 4.2 g/dL (ref 3.5–5.2)
Alkaline Phosphatase: 55 U/L (ref 39–117)
Bilirubin, Direct: 0 mg/dL (ref 0.0–0.3)
Total Bilirubin: 0.8 mg/dL (ref 0.3–1.2)
Total Protein: 7.5 g/dL (ref 6.0–8.3)

## 2012-08-28 LAB — BASIC METABOLIC PANEL WITH GFR
BUN: 14 mg/dL (ref 6–23)
CO2: 25 meq/L (ref 19–32)
Calcium: 9.1 mg/dL (ref 8.4–10.5)
Chloride: 107 meq/L (ref 96–112)
Creatinine, Ser: 0.9 mg/dL (ref 0.4–1.5)
GFR: 93.27 mL/min
Glucose, Bld: 97 mg/dL (ref 70–99)
Potassium: 4 meq/L (ref 3.5–5.1)
Sodium: 139 meq/L (ref 135–145)

## 2012-08-28 MED ORDER — AZITHROMYCIN 250 MG PO TABS: ORAL_TABLET | ORAL | Status: DC

## 2012-08-28 NOTE — Progress Notes (Signed)
  Subjective:    Patient ID: Andrew Shields, male    DOB: Nov 03, 1965, 47 y.o.   MRN: [id number]  HPI Acute visit. Patient developed on New Year's Eve body aches, sore throat, laryngitis, and productive cough. Had progression facial pain since then and purulent nasal secretions. Intermittent mild headache. No nausea or vomiting. Mucinex and nasal saline irrigation without much improvement   Review of Systems  Constitutional: Negative for fever, chills and fatigue.  HENT: Positive for congestion and sinus pressure.   Respiratory: Positive for cough. Negative for shortness of breath and wheezing.        Objective:   Physical Exam  Constitutional: He appears well-developed and well-nourished.  HENT:  Right Ear: External ear normal.  Left Ear: External ear normal.       Patient has thick yellow mucus posterior pharynx and erythema involving uvula. No exudate  Neck: Neck supple. No thyromegaly present.  Cardiovascular: Normal rate and regular rhythm.   Pulmonary/Chest: Effort normal and breath sounds normal. No respiratory distress. He has no wheezes. He has no rales.          Assessment & Plan:  Pharyngitis. Probable acute sinusitis. Allergy to penicillin. Zithromax for 5 days. Rapid strep negative.

## 2012-08-28 NOTE — Progress Notes (Unsigned)
  Subjective:    Patient ID: Andrew Shields, male    DOB: [birth year], 47 y.o.   MRN: [id number]  HPI    Review of Systems     Objective:   Physical Exam        Assessment & Plan:

## 2012-09-02 ENCOUNTER — Ambulatory Visit (INDEPENDENT_AMBULATORY_CARE_PROVIDER_SITE_OTHER): Payer: PRIVATE HEALTH INSURANCE | Admitting: Family Medicine

## 2012-09-02 ENCOUNTER — Encounter: Payer: Self-pay | Admitting: Family Medicine

## 2012-09-02 VITALS — BP 110/72 | HR 72 | Temp 97.3°F | Resp 12 | Ht 67.0 in | Wt 235.0 lb

## 2012-09-02 DIAGNOSIS — Z0000 Encounter for general adult medical examination without abnormal findings: Secondary | ICD-10-CM

## 2012-09-02 DIAGNOSIS — Z23 Encounter for immunization: Secondary | ICD-10-CM

## 2012-09-02 LAB — POCT URINALYSIS DIPSTICK
Bilirubin, UA: NEGATIVE
Blood, UA: NEGATIVE
Glucose, UA: NEGATIVE
Ketones, UA: NEGATIVE
Leukocytes, UA: NEGATIVE
Nitrite, UA: NEGATIVE
Protein, UA: NEGATIVE
Spec Grav, UA: >=1.03
Urobilinogen, UA: 0.2
pH, UA: 6

## 2012-09-02 MED ORDER — GEMFIBROZIL 600 MG PO TABS: 600.0000 mg | ORAL_TABLET | Freq: Two times a day (BID) | ORAL | Status: DC

## 2012-09-02 NOTE — Patient Instructions (Addendum)
Work on trying to lose some weight

## 2012-09-02 NOTE — Progress Notes (Signed)
  Subjective:    Patient ID: Andrew Shields, male    DOB: Nov 06, 1965, 47 y.o.   MRN: [id number]  HPI Patient here for complete physical. Sinus symptoms from last visit are improving. Patient has history of dyslipidemia. Strong family history of diabetes. Mother had colon cancer. Patient had colonoscopy 2011 normal. Recommended followup 2016. In consistent exercise. Tetanus up-to-date. No flu vaccine yet. Nonsmoker.  Past Medical History  Diagnosis Date  . Hyperlipidemia    Past Surgical History  Procedure Date  . Tonsillectomy     reports that he has never smoked. He does not have any smokeless tobacco history on file. His alcohol and drug histories not on file. family history includes Alcohol abuse in his father; Cancer in his father and mother; Diabetes in his mother and sister; Heart disease (age of onset:60) in his mother; and Hyperlipidemia in his brother. Allergies  Allergen Reactions  . Amoxicillin-Pot Clavulanate     REACTION: difficulty breathing      Review of Systems  Constitutional: Negative for fever, activity change, appetite change, fatigue and unexpected weight change.  HENT: Negative for ear pain, congestion and trouble swallowing.   Eyes: Negative for pain and visual disturbance.  Respiratory: Negative for cough, shortness of breath and wheezing.   Cardiovascular: Negative for chest pain and palpitations.  Gastrointestinal: Negative for nausea, vomiting, abdominal pain, diarrhea, constipation, blood in stool, abdominal distention and rectal pain.  Genitourinary: Negative for dysuria, hematuria and testicular pain.  Musculoskeletal: Negative for joint swelling and arthralgias.  Skin: Negative for rash.  Neurological: Negative for dizziness, syncope and headaches.  Hematological: Negative for adenopathy.  Psychiatric/Behavioral: Negative for confusion and dysphoric mood.       Objective:   Physical Exam  Constitutional: He is oriented to person, place, and  time. He appears well-developed and well-nourished. No distress.  HENT:  Head: Normocephalic and atraumatic.  Right Ear: External ear normal.  Left Ear: External ear normal.  Mouth/Throat: Oropharynx is clear and moist.  Eyes: Conjunctivae normal and EOM are normal. Pupils are equal, round, and reactive to light.  Neck: Normal range of motion. Neck supple. No thyromegaly present.  Cardiovascular: Normal rate, regular rhythm and normal heart sounds.   No murmur heard. Pulmonary/Chest: No respiratory distress. He has no wheezes. He has no rales.  Abdominal: Soft. Bowel sounds are normal. He exhibits no distension and no mass. There is no tenderness. There is no rebound and no guarding.  Musculoskeletal: He exhibits no edema.  Lymphadenopathy:    He has no cervical adenopathy.  Neurological: He is alert and oriented to person, place, and time. He displays normal reflexes. No cranial nerve deficit.  Skin: No rash noted.       Several scattered nevi but no atypical features  Psychiatric: He has a normal mood and affect.          Assessment & Plan:  Complete physical. Labs reviewed with patient.  Trace blood on urine dipstick. Repeat urinalysis and send for urine micro-if still positive. Flu vaccine given. Tetanus up-to-date. Work on trying to lose some weight and establish more consistent exercise

## 2013-05-07 ENCOUNTER — Ambulatory Visit (INDEPENDENT_AMBULATORY_CARE_PROVIDER_SITE_OTHER): Payer: PRIVATE HEALTH INSURANCE | Admitting: Family Medicine

## 2013-05-07 ENCOUNTER — Encounter: Payer: Self-pay | Admitting: Family Medicine

## 2013-05-07 VITALS — BP 102/64 | HR 64 | Temp 98.0°F | Ht 68.0 in | Wt 230.0 lb

## 2013-05-07 DIAGNOSIS — Z23 Encounter for immunization: Secondary | ICD-10-CM

## 2013-05-07 DIAGNOSIS — N451 Epididymitis: Secondary | ICD-10-CM

## 2013-05-07 DIAGNOSIS — N453 Epididymo-orchitis: Secondary | ICD-10-CM

## 2013-05-07 MED ORDER — DOXYCYCLINE HYCLATE 100 MG PO TABS: 100.0000 mg | ORAL_TABLET | Freq: Two times a day (BID) | ORAL | Status: DC

## 2013-05-07 NOTE — Addendum Note (Signed)
Addended by: Thomasena Edis on: 05/07/2013 10:22 AM   Modules accepted: Orders

## 2013-05-07 NOTE — Progress Notes (Signed)
  Subjective:    Patient ID: Andrew Shields, male    DOB: 07-08-1966, 47 y.o.   MRN: [id number]  HPI Acute visit for left testicle pain. Onset about 2-3 weeks ago. No injury. He describes achy pain relatively mild severity. No radiation. Denies dysuria. Denies any fever or chills. No adenopathy. Ibuprofen helps somewhat. No burning with urination nor any penile discharge. Patient is monogamous.  Past Medical History  Diagnosis Date  . Hyperlipidemia    Past Surgical History  Procedure Laterality Date  . Tonsillectomy      reports that he has never smoked. He does not have any smokeless tobacco history on file. His alcohol and drug histories are not on file. family history includes Alcohol abuse in his father; Cancer in his father and mother; Diabetes in his mother and sister; Heart disease (age of onset: 57) in his mother; Hyperlipidemia in his brother. Allergies  Allergen Reactions  . Amoxicillin-Pot Clavulanate     REACTION: difficulty breathing       Review of Systems  Constitutional: Negative for fever and chills.  Endocrine: Negative for polydipsia and polyuria.  Genitourinary: Positive for testicular pain. Negative for dysuria, discharge and genital sores.  Hematological: Negative for adenopathy.       Objective:   Physical Exam  Constitutional: He appears well-developed and well-nourished.  Cardiovascular: Normal rate and regular rhythm.   Pulmonary/Chest: Effort normal and breath sounds normal. No respiratory distress. He has no wheezes. He has no rales.  Genitourinary:  Testicular exam reveals no masses. He has some tenderness involving the left epididymis. No hernia. We cannot appreciate any left testicle enlargement          Assessment & Plan:  Acute epididymitis left testicle. We explained there can be infectious and noninfectious etiologies. Warm sitz baths. Start doxycycline 100 mg twice a day for 10 days. Touch base 2 weeks if not resolving.  Continue over-the-counter anti-inflammatories

## 2013-05-07 NOTE — Patient Instructions (Addendum)
Epididymitis Epididymitis is a swelling (inflammation) of the epididymis. The epididymis is a cord-like structure along the back part of the testicle. Epididymitis is usually, but not always, caused by infection. This is usually a sudden problem beginning with chills, fever and pain behind the scrotum and in the testicle. There may be swelling and redness of the testicle. DIAGNOSIS  Physical examination will reveal a tender, swollen epididymis. Sometimes, cultures are obtained from the urine or from prostate secretions to help find out if there is an infection or if the cause is a different problem. Sometimes, blood work is performed to see if your white blood cell count is elevated and if a germ (bacterial) or viral infection is present. Using this knowledge, an appropriate medicine which kills germs (antibiotic) can be chosen by your caregiver. A viral infection causing epididymitis will most often go away (resolve) without treatment. HOME CARE INSTRUCTIONS   Hot sitz baths for 20 minutes, 4 times per day, may help relieve pain.  Only take over-the-counter or prescription medicines for pain, discomfort or fever as directed by your caregiver.  Take all medicines, including antibiotics, as directed. Take the antibiotics for the full prescribed length of time even if you are feeling better.  It is very important to keep all follow-up appointments. SEEK IMMEDIATE MEDICAL CARE IF:   You have a fever.  You have pain not relieved with medicines.  You have any worsening of your problems.  Your pain seems to come and go.  You develop pain, redness, and swelling in the scrotum and surrounding areas. MAKE SURE YOU:   Understand these instructions.  Will watch your condition.  Will get help right away if you are not doing well or get worse. Document Released: 08/09/2000 Document Revised: 11/04/2011 Document Reviewed: 06/29/2009 [facility] Patient Information 2014 [place], [place]. Call in 2 weeks  if not improved.

## 2013-06-09 ENCOUNTER — Ambulatory Visit (INDEPENDENT_AMBULATORY_CARE_PROVIDER_SITE_OTHER): Payer: PRIVATE HEALTH INSURANCE | Admitting: Family Medicine

## 2013-06-09 ENCOUNTER — Encounter: Payer: Self-pay | Admitting: Family Medicine

## 2013-06-09 VITALS — BP 106/74 | HR 57 | Temp 97.7°F | Wt 232.0 lb

## 2013-06-09 DIAGNOSIS — N453 Epididymo-orchitis: Secondary | ICD-10-CM

## 2013-06-09 DIAGNOSIS — N451 Epididymitis: Secondary | ICD-10-CM

## 2013-06-09 MED ORDER — CIPROFLOXACIN HCL 500 MG PO TABS: 500.0000 mg | ORAL_TABLET | Freq: Two times a day (BID) | ORAL | Status: DC

## 2013-06-09 NOTE — Patient Instructions (Signed)
Epididymitis  Epididymitis is a swelling (inflammation) of the epididymis. The epididymis is a cord-like structure along the back part of the testicle. Epididymitis is usually, but not always, caused by infection. This is usually a sudden problem beginning with chills, fever and pain behind the scrotum and in the testicle. There may be swelling and redness of the testicle.  DIAGNOSIS   Physical examination will reveal a tender, swollen epididymis. Sometimes, cultures are obtained from the urine or from prostate secretions to help find out if there is an infection or if the cause is a different problem. Sometimes, blood work is performed to see if your white blood cell count is elevated and if a germ (bacterial) or viral infection is present. Using this knowledge, an appropriate medicine which kills germs (antibiotic) can be chosen by your caregiver. A viral infection causing epididymitis will most often go away (resolve) without treatment.  HOME CARE INSTRUCTIONS   · Hot sitz baths for 20 minutes, 4 times per day, may help relieve pain.  · Only take over-the-counter or prescription medicines for pain, discomfort or fever as directed by your caregiver.  · Take all medicines, including antibiotics, as directed. Take the antibiotics for the full prescribed length of time even if you are feeling better.  · It is very important to keep all follow-up appointments.  SEEK IMMEDIATE MEDICAL CARE IF:   · You have a fever.  · You have pain not relieved with medicines.  · You have any worsening of your problems.  · Your pain seems to come and go.  · You develop pain, redness, and swelling in the scrotum and surrounding areas.  MAKE SURE YOU:   · Understand these instructions.  · Will watch your condition.  · Will get help right away if you are not doing well or get worse.  Document Released: 08/09/2000 Document Revised: 11/04/2011 Document Reviewed: 06/29/2009  [employer]® Patient Information ©2014 [employer].

## 2013-06-09 NOTE — Progress Notes (Signed)
  Subjective:    Patient ID: Andrew Shields, male    DOB: 06/28/1966, 47 y.o.   MRN: [id number]  HPI Patient seen with recurrent pain left epididymis region. Refer to prior note. We treated with doxycycline and his pain did improve. Pain remains confined to the left testicle region. He has not seen any swelling or edema. No injury. No dysuria. No fever or chills. No penile discharge. No history of hernia. Ibuprofen initially helped but not recently  Past Medical History  Diagnosis Date  . Hyperlipidemia    Past Surgical History  Procedure Laterality Date  . Tonsillectomy      reports that he has never smoked. He does not have any smokeless tobacco history on file. His alcohol and drug histories are not on file. family history includes Alcohol abuse in his father; Cancer in his father and mother; Diabetes in his mother and sister; Heart disease (age of onset: 80) in his mother; Hyperlipidemia in his brother. Allergies  Allergen Reactions  . Amoxicillin-Pot Clavulanate     REACTION: difficulty breathing      Review of Systems  Constitutional: Negative for fever and chills.  Genitourinary: Positive for testicular pain. Negative for dysuria, hematuria, discharge, penile swelling and scrotal swelling.       Objective:   Physical Exam  Constitutional: He appears well-developed and well-nourished.  Cardiovascular: Normal rate and regular rhythm.   Pulmonary/Chest: Effort normal and breath sounds normal. No respiratory distress. He has no wheezes. He has no rales.  Genitourinary:  Patient has some left epididymis tenderness. No testicle mass. No hernia.          Assessment & Plan:  Left epididymitis. He did improve previously with antibiotic. Cipro 500 mg twice a day for 10 days. If not resolving with this will recommend urology referral versus scrotal ultrasound

## 2013-08-10 ENCOUNTER — Telehealth: Payer: Self-pay | Admitting: Family Medicine

## 2013-08-10 DIAGNOSIS — N5082 Scrotal pain: Secondary | ICD-10-CM

## 2013-08-10 NOTE — Telephone Encounter (Signed)
Let pt know I have made referral

## 2013-08-10 NOTE — Telephone Encounter (Signed)
Pt is still having problems with his prostate. He would like to be referred to a urologist. Pt was advised to call back if he was still having issues for a referral.

## 2013-08-11 NOTE — Telephone Encounter (Signed)
Left message on VM.

## 2013-10-16 ENCOUNTER — Other Ambulatory Visit: Payer: Self-pay | Admitting: Family Medicine

## 2013-11-10 ENCOUNTER — Other Ambulatory Visit (HOSPITAL_COMMUNITY): Payer: Self-pay | Admitting: Urology

## 2013-11-10 DIAGNOSIS — N50819 Testicular pain, unspecified: Secondary | ICD-10-CM

## 2013-11-15 ENCOUNTER — Ambulatory Visit (HOSPITAL_COMMUNITY)
Admission: RE | Admit: 2013-11-15 | Discharge: 2013-11-15 | Disposition: A | Discharge status: Home / Self Care | Payer: PRIVATE HEALTH INSURANCE | Source: Ambulatory Visit | Attending: Urology | Admitting: Urology

## 2013-11-15 DIAGNOSIS — M51379 Other intervertebral disc degeneration, lumbosacral region without mention of lumbar back pain or lower extremity pain: Secondary | ICD-10-CM | POA: Insufficient documentation

## 2013-11-15 DIAGNOSIS — M48061 Spinal stenosis, lumbar region without neurogenic claudication: Secondary | ICD-10-CM | POA: Insufficient documentation

## 2013-11-15 DIAGNOSIS — N509 Disorder of male genital organs, unspecified: Secondary | ICD-10-CM | POA: Insufficient documentation

## 2013-11-15 DIAGNOSIS — M5137 Other intervertebral disc degeneration, lumbosacral region: Secondary | ICD-10-CM | POA: Insufficient documentation

## 2013-11-15 DIAGNOSIS — X58XXXA Exposure to other specified factors, initial encounter: Secondary | ICD-10-CM | POA: Insufficient documentation

## 2013-11-15 DIAGNOSIS — N50819 Testicular pain, unspecified: Secondary | ICD-10-CM

## 2013-11-15 DIAGNOSIS — S32009A Unspecified fracture of unspecified lumbar vertebra, initial encounter for closed fracture: Secondary | ICD-10-CM | POA: Insufficient documentation

## 2014-04-22 ENCOUNTER — Other Ambulatory Visit (INDEPENDENT_AMBULATORY_CARE_PROVIDER_SITE_OTHER): Payer: PRIVATE HEALTH INSURANCE

## 2014-04-22 DIAGNOSIS — Z0000 Encounter for general adult medical examination without abnormal findings: Secondary | ICD-10-CM

## 2014-04-22 DIAGNOSIS — R7989 Other specified abnormal findings of blood chemistry: Secondary | ICD-10-CM

## 2014-04-22 LAB — POCT URINALYSIS DIPSTICK
Bilirubin, UA: NEGATIVE
Glucose, UA: NEGATIVE
Ketones, UA: NEGATIVE
Leukocytes, UA: NEGATIVE
Nitrite, UA: NEGATIVE
Protein, UA: NEGATIVE
Spec Grav, UA: 1.02
Urobilinogen, UA: 0.2
pH, UA: 5.5

## 2014-04-22 LAB — CBC WITH DIFFERENTIAL/PLATELET
Basophils Absolute: 0 10*3/uL (ref 0.0–0.1)
Basophils Relative: 0.6 % (ref 0.0–3.0)
Eosinophils Absolute: 0.1 10*3/uL (ref 0.0–0.7)
Eosinophils Relative: 1.6 % (ref 0.0–5.0)
HCT: 42.2 % (ref 39.0–52.0)
Hemoglobin: 14.2 g/dL (ref 13.0–17.0)
Lymphocytes Relative: 49.3 % — ABNORMAL HIGH (ref 12.0–46.0)
Lymphs Abs: 2.7 10*3/uL (ref 0.7–4.0)
MCHC: 33.7 g/dL (ref 30.0–36.0)
MCV: 91.9 fl (ref 78.0–100.0)
Monocytes Absolute: 0.5 10*3/uL (ref 0.1–1.0)
Monocytes Relative: 9 % (ref 3.0–12.0)
Neutro Abs: 2.2 10*3/uL (ref 1.4–7.7)
Neutrophils Relative %: 39.5 % — ABNORMAL LOW (ref 43.0–77.0)
Platelets: 225 10*3/uL (ref 150.0–400.0)
RBC: 4.59 Mil/uL (ref 4.22–5.81)
RDW: 13.5 % (ref 11.5–15.5)
WBC: 5.6 10*3/uL (ref 4.0–10.5)

## 2014-04-22 LAB — LIPID PANEL
Cholesterol: 208 mg/dL — ABNORMAL HIGH (ref 0–200)
HDL: 34.6 mg/dL — ABNORMAL LOW (ref >39.00)
NonHDL: 173.4
Total CHOL/HDL Ratio: 6
Triglycerides: 224 mg/dL — ABNORMAL HIGH (ref 0.0–149.0)
VLDL: 44.8 mg/dL — ABNORMAL HIGH (ref 0.0–40.0)

## 2014-04-22 LAB — BASIC METABOLIC PANEL
BUN: 18 mg/dL (ref 6–23)
CO2: 26 mEq/L (ref 19–32)
Calcium: 9.2 mg/dL (ref 8.4–10.5)
Chloride: 108 mEq/L (ref 96–112)
Creatinine, Ser: 1 mg/dL (ref 0.4–1.5)
GFR: 89.27 mL/min (ref >60.00)
Glucose, Bld: 108 mg/dL — ABNORMAL HIGH (ref 70–99)
Potassium: 5 mEq/L (ref 3.5–5.1)
Sodium: 142 mEq/L (ref 135–145)

## 2014-04-22 LAB — HEPATIC FUNCTION PANEL
ALT: 22 U/L (ref 0–53)
AST: 18 U/L (ref 0–37)
Albumin: 4 g/dL (ref 3.5–5.2)
Alkaline Phosphatase: 53 U/L (ref 39–117)
Bilirubin, Direct: 0 mg/dL (ref 0.0–0.3)
Total Bilirubin: 0.7 mg/dL (ref 0.2–1.2)
Total Protein: 7 g/dL (ref 6.0–8.3)

## 2014-04-22 LAB — TSH: TSH: 1.61 u[IU]/mL (ref 0.35–4.50)

## 2014-04-22 LAB — LDL CHOLESTEROL, DIRECT: Direct LDL: 150.3 mg/dL

## 2014-04-28 ENCOUNTER — Ambulatory Visit (INDEPENDENT_AMBULATORY_CARE_PROVIDER_SITE_OTHER): Payer: PRIVATE HEALTH INSURANCE | Admitting: Family Medicine

## 2014-04-28 ENCOUNTER — Encounter: Payer: Self-pay | Admitting: Family Medicine

## 2014-04-28 VITALS — BP 118/78 | HR 60 | Temp 98.5°F | Ht 68.0 in | Wt 232.2 lb

## 2014-04-28 DIAGNOSIS — Z0000 Encounter for general adult medical examination without abnormal findings: Secondary | ICD-10-CM

## 2014-04-28 DIAGNOSIS — R319 Hematuria, unspecified: Secondary | ICD-10-CM

## 2014-04-28 DIAGNOSIS — Z23 Encounter for immunization: Secondary | ICD-10-CM

## 2014-04-28 LAB — URINALYSIS, MICROSCOPIC ONLY

## 2014-04-28 LAB — POCT URINALYSIS DIPSTICK
Bilirubin, UA: NEGATIVE
Glucose, UA: NEGATIVE
Leukocytes, UA: NEGATIVE
Nitrite, UA: NEGATIVE
Protein, UA: NEGATIVE
Spec Grav, UA: 1.025
Urobilinogen, UA: 0.2
pH, UA: 5

## 2014-04-28 MED ORDER — GEMFIBROZIL 600 MG PO TABS: 600.0000 mg | ORAL_TABLET | Freq: Two times a day (BID) | ORAL | Status: DC

## 2014-04-28 NOTE — Progress Notes (Signed)
   Subjective:    Patient ID: Andrew Shields, male    DOB: [birth year], 48 y.o.   MRN: [id number]  HPI Patient here for complete physical. He has history of dyslipidemia with high triglycerides. Has been on Lopid for many years. Intolerant to [employer]. Mother had colon cancer and patient had colonoscopy 2011. No history of smoking. He's had some chronic prostatodynia and has seen urologist and had multiple tests done with no clear etiology. He eventually saw a neurosurgeon and had epidural and symptoms did seem to improve slightly following that.  No consistent exercise. Needs flu vaccine. Tetanus up-to-date.   Past Medical History  Diagnosis Date  . Hyperlipidemia    Past Surgical History  Procedure Laterality Date  . Tonsillectomy      reports that he has never smoked. He does not have any smokeless tobacco history on file. His alcohol and drug histories are not on file. family history includes Alcohol abuse in his father; Cancer in his father and mother; Diabetes in his mother and sister; Heart disease (age of onset: 21) in his mother; Hyperlipidemia in his brother. Allergies  Allergen Reactions  . Amoxicillin-Pot Clavulanate     REACTION: difficulty breathing      Review of Systems  Constitutional: Negative for fever, chills, activity change, appetite change and fatigue.  HENT: Negative for congestion, ear pain and trouble swallowing.   Eyes: Negative for pain and visual disturbance.  Respiratory: Negative for cough, shortness of breath and wheezing.   Cardiovascular: Negative for chest pain and palpitations.  Gastrointestinal: Negative for nausea, vomiting, abdominal pain, diarrhea, constipation, blood in stool, abdominal distention and rectal pain.  Genitourinary: Negative for dysuria, hematuria and testicular pain.  Musculoskeletal: Negative for arthralgias and joint swelling.  Skin: Negative for rash.  Neurological: Negative for dizziness, syncope and headaches.    Hematological: Negative for adenopathy.  Psychiatric/Behavioral: Negative for confusion and dysphoric mood.       Objective:   Physical Exam  Constitutional: He is oriented to person, place, and time. He appears well-developed and well-nourished. No distress.  HENT:  Head: Normocephalic and atraumatic.  Right Ear: External ear normal.  Left Ear: External ear normal.  Mouth/Throat: Oropharynx is clear and moist.  Eyes: Conjunctivae and EOM are normal. Pupils are equal, round, and reactive to light.  Neck: Normal range of motion. Neck supple. No thyromegaly present.  Cardiovascular: Normal rate, regular rhythm and normal heart sounds.   No murmur heard. Pulmonary/Chest: No respiratory distress. He has no wheezes. He has no rales.  Abdominal: Soft. Bowel sounds are normal. He exhibits no distension and no mass. There is no tenderness. There is no rebound and no guarding.  Genitourinary:  Deferred as he has had multiple exams per urology  Musculoskeletal: He exhibits no edema.  Lymphadenopathy:    He has no cervical adenopathy.  Neurological: He is alert and oriented to person, place, and time. He displays normal reflexes. No cranial nerve deficit.  Skin: No rash noted.  Psychiatric: He has a normal mood and affect.          Assessment & Plan:  Complete physical. Labs reviewed. Prediabetes. Hyperlipidemia. We strongly advise weight loss and more consistent exercise. Repeat colonoscopy by next year. Flu vaccine given. Tetanus up-to-date.  Send urine for microscopy

## 2014-04-28 NOTE — Patient Instructions (Addendum)
Try to lose some weight over the next year. You will need repeat colonoscopy done next year with positive family history of colon cancer Your medication-Lopid-was refilled for one year Try to reduce sugar and starch intake

## 2014-04-28 NOTE — Progress Notes (Signed)
Pre visit review using our clinic review tool, if applicable. No additional management support is needed unless otherwise documented below in the visit note. 

## 2015-02-22 ENCOUNTER — Other Ambulatory Visit: Payer: Self-pay | Admitting: Family Medicine

## 2015-03-13 ENCOUNTER — Encounter: Payer: Self-pay | Admitting: Internal Medicine

## 2015-05-10 ENCOUNTER — Encounter: Payer: Self-pay | Admitting: Gastroenterology

## 2015-05-10 ENCOUNTER — Other Ambulatory Visit: Payer: Self-pay

## 2015-05-10 ENCOUNTER — Ambulatory Visit (INDEPENDENT_AMBULATORY_CARE_PROVIDER_SITE_OTHER): Payer: PRIVATE HEALTH INSURANCE | Admitting: Gastroenterology

## 2015-05-10 VITALS — BP 130/77 | HR 58 | Temp 97.8°F | Ht 70.0 in | Wt 231.4 lb

## 2015-05-10 DIAGNOSIS — R1314 Dysphagia, pharyngoesophageal phase: Secondary | ICD-10-CM

## 2015-05-10 DIAGNOSIS — Z800 Family history of malignant neoplasm of digestive organs: Secondary | ICD-10-CM

## 2015-05-10 DIAGNOSIS — R1310 Dysphagia, unspecified: Secondary | ICD-10-CM | POA: Insufficient documentation

## 2015-05-10 DIAGNOSIS — R1319 Other dysphagia: Secondary | ICD-10-CM | POA: Insufficient documentation

## 2015-05-10 MED ORDER — PEG 3350-KCL-NA BICARB-NACL 420 G PO SOLR: 4000.0000 mL | ORAL | Status: DC

## 2015-05-10 NOTE — Assessment & Plan Note (Signed)
Due for high-risk screening colonoscopy. Augment conscious sedation with Phenergan 25 mg IV 30 minutes before the procedure. I have discussed the risks, alternatives, benefits with regards to but not limited to the risk of reaction to medication, bleeding, infection, perforation and the patient is agreeable to proceed. Written consent to be obtained.

## 2015-05-10 NOTE — Assessment & Plan Note (Signed)
49 year old gentleman with several month history of solid food esophageal dysphagia. No typical heartburn. Early morning symptoms of congestion, nausea possibly related to GERD. Recommend EGD with dilation in the near future. Augment conscious sedation with Phenergan 25 mg IV 30 minutes before the procedure given use of antidepressive.  I have discussed the risks, alternatives, benefits with regards to but not limited to the risk of reaction to medication, bleeding, infection, perforation and the patient is agreeable to proceed. Written consent to be obtained.

## 2015-05-10 NOTE — Patient Instructions (Signed)
1. Colonoscopy and upper endoscopy with Dr. Rourk. See separate instructions. 

## 2015-05-10 NOTE — Progress Notes (Signed)
Primary Care Physician:  [name], MD  Primary Gastroenterologist:  [name], MD   Chief Complaint  Patient presents with  . Dysphagia  . set up TCS    HPI:  Andrew Shields is a 49 y.o. male here for high-risk screening colonoscopy. His mother was diagnosed with colon cancer in her late 58s. Patient's last colonoscopy was in 2011, unremarkable.  Patient complains of several month history of solid food dysphagia. Symptoms are intermittent. Develops chest pressure when it occurs. No significant heartburn symptoms. Complains of intermittent nausea, especially when he is brushing his teeth. When he gets up the morning has to bring up a lot of phlegm. No abdominal pain. BM regular, every day. No melena, brbpr. Several times per day. No unintentional weight loss.  Current Outpatient Prescriptions  Medication Sig Dispense Refill  . DULoxetine (CYMBALTA) 30 MG capsule Take 30 mg by mouth daily.    . fish oil-omega-3 fatty acids 1000 MG capsule Take 2 g by mouth daily.      Marland Kitchen gemfibrozil (LOPID) 600 MG tablet TAKE 1 TABLET BY MOUTH TWICE DAILY BEFORE A MEAL. 180 tablet 0   No current facility-administered medications for this visit.    Allergies as of 05/10/2015 - Review Complete 05/10/2015  Allergen Reaction Noted  . Amoxicillin-pot clavulanate  03/21/2010    Past Medical History  Diagnosis Date  . Hyperlipidemia     Past Surgical History  Procedure Laterality Date  . Tonsillectomy    . Colonoscopy  04/19/2010    RMR: 1. normal rectum 2. normal colon. 3. Normal terminal ileum     Family History  Problem Relation Age of Onset  . Colon cancer Mother     age late 78s  . Heart disease Mother 4    stent  . Diabetes Mother     type ll  . Alcohol abuse Father   . Cancer Father     lung  . Hyperlipidemia Brother   . Diabetes Sister     ?type 2    Social History   Social History  . Marital Status: Married    Spouse Name: N/A  . Number of Children: 0  .  Years of Education: N/A   Occupational History  . Not on file.   Social History Main Topics  . Smoking status: Never Smoker   . Smokeless tobacco: Not on file  . Alcohol Use: No  . Drug Use: No  . Sexual Activity: Not on file   Other Topics Concern  . Not on file   Social History Narrative      ROS:  General: Negative for anorexia, weight loss, fever, chills, fatigue, weakness. Eyes: Negative for vision changes.  ENT: Negative for hoarseness, difficulty swallowing , nasal congestion. CV: Negative for chest pain, angina, palpitations, dyspnea on exertion, peripheral edema.  Respiratory: Negative for dyspnea at rest, dyspnea on exertion, cough, sputum, wheezing.  GI: See history of present illness. GU:  Negative for dysuria, hematuria, urinary incontinence, urinary frequency, nocturnal urination.  MS: Negative for joint pain, low back pain.  Derm: Negative for rash or itching.  Neuro: Negative for weakness, abnormal sensation, seizure, frequent headaches, memory loss, confusion.  Psych: Negative for anxiety, depression, suicidal ideation, hallucinations.  Endo: Negative for unusual weight change.  Heme: Negative for bruising or bleeding. Allergy: Negative for rash or hives.    Physical Examination:  BP 130/77 mmHg  Pulse 58  Temp(Src) 97.8 F (36.6 C)  Ht  (1.778 m)  Wt 231 lb 6.4 oz (104.962 kg)  BMI 33.20 kg/m2   General: Well-nourished, well-developed in no acute distress.  Head: Normocephalic, atraumatic.   Eyes: Conjunctiva pink, no icterus. Mouth: Oropharyngeal mucosa moist and pink , no lesions erythema or exudate. Neck: Supple without thyromegaly, masses, or lymphadenopathy.  Lungs: Clear to auscultation bilaterally.  Heart: Regular rate and rhythm, no murmurs rubs or gallops.  Abdomen: Bowel sounds are normal, nontender, nondistended, no hepatosplenomegaly or masses, no abdominal bruits or    hernia , no rebound or guarding.   Rectal: Not  performed Extremities: No lower extremity edema. No clubbing or deformities.  Neuro: Alert and oriented x 4 , grossly normal neurologically.  Skin: Warm and dry, no rash or jaundice.   Psych: Alert and cooperative, normal mood and affect.  Labs: Lab Results  Component Value Date   ALT 22 04/22/2014   AST 18 04/22/2014   ALKPHOS 53 04/22/2014   BILITOT 0.7 04/22/2014   Lab Results  Component Value Date   CREATININE 1.0 04/22/2014   BUN 18 04/22/2014   NA 142 04/22/2014   K 5.0 04/22/2014   CL 108 04/22/2014   CO2 26 04/22/2014   Lab Results  Component Value Date   TSH 1.61 04/22/2014     Imaging Studies: No results found.

## 2015-05-11 NOTE — Progress Notes (Signed)
CC'ED TO PCP 

## 2015-06-06 ENCOUNTER — Encounter (HOSPITAL_COMMUNITY)
Admission: RE | Disposition: A | Discharge status: Home / Self Care | Payer: Self-pay | Source: Ambulatory Visit | Attending: Internal Medicine

## 2015-06-06 ENCOUNTER — Other Ambulatory Visit: Payer: Self-pay | Admitting: Internal Medicine

## 2015-06-06 ENCOUNTER — Ambulatory Visit (HOSPITAL_COMMUNITY)
Admission: RE | Admit: 2015-06-06 | Discharge: 2015-06-06 | Disposition: A | Discharge status: Home / Self Care | Payer: PRIVATE HEALTH INSURANCE | Source: Ambulatory Visit | Attending: Internal Medicine | Admitting: Internal Medicine

## 2015-06-06 ENCOUNTER — Encounter (HOSPITAL_COMMUNITY): Payer: Self-pay

## 2015-06-06 DIAGNOSIS — K210 Gastro-esophageal reflux disease with esophagitis: Secondary | ICD-10-CM | POA: Insufficient documentation

## 2015-06-06 DIAGNOSIS — Z801 Family history of malignant neoplasm of trachea, bronchus and lung: Secondary | ICD-10-CM | POA: Diagnosis not present

## 2015-06-06 DIAGNOSIS — Z800 Family history of malignant neoplasm of digestive organs: Secondary | ICD-10-CM | POA: Diagnosis not present

## 2015-06-06 DIAGNOSIS — Z1211 Encounter for screening for malignant neoplasm of colon: Secondary | ICD-10-CM

## 2015-06-06 DIAGNOSIS — K2210 Ulcer of esophagus without bleeding: Secondary | ICD-10-CM | POA: Diagnosis not present

## 2015-06-06 DIAGNOSIS — K449 Diaphragmatic hernia without obstruction or gangrene: Secondary | ICD-10-CM | POA: Diagnosis not present

## 2015-06-06 DIAGNOSIS — Z79899 Other long term (current) drug therapy: Secondary | ICD-10-CM | POA: Insufficient documentation

## 2015-06-06 DIAGNOSIS — R1310 Dysphagia, unspecified: Secondary | ICD-10-CM | POA: Insufficient documentation

## 2015-06-06 DIAGNOSIS — R1314 Dysphagia, pharyngoesophageal phase: Secondary | ICD-10-CM

## 2015-06-06 DIAGNOSIS — E785 Hyperlipidemia, unspecified: Secondary | ICD-10-CM | POA: Diagnosis not present

## 2015-06-06 DIAGNOSIS — K2950 Unspecified chronic gastritis without bleeding: Secondary | ICD-10-CM | POA: Insufficient documentation

## 2015-06-06 DIAGNOSIS — K3189 Other diseases of stomach and duodenum: Secondary | ICD-10-CM | POA: Diagnosis not present

## 2015-06-06 HISTORY — PX: ESOPHAGOGASTRODUODENOSCOPY: SHX5428

## 2015-06-06 HISTORY — PX: ESOPHAGEAL DILATION: SHX303

## 2015-06-06 HISTORY — PX: COLONOSCOPY: SHX5424

## 2015-06-06 SURGERY — COLONOSCOPY
Anesthesia: Moderate Sedation

## 2015-06-06 MED ORDER — LIDOCAINE VISCOUS 2 % MT SOLN
OROMUCOSAL | Status: AC
  Filled 2015-06-06: qty 15

## 2015-06-06 MED ORDER — SODIUM CHLORIDE 0.9 % IJ SOLN
INTRAMUSCULAR | Status: AC
  Filled 2015-06-06: qty 3

## 2015-06-06 MED ORDER — MIDAZOLAM HCL 5 MG/5ML IJ SOLN
INTRAMUSCULAR | Status: DC | PRN
  Administered 2015-06-06: 2 mg via INTRAVENOUS
  Administered 2015-06-06: 1 mg via INTRAVENOUS
  Administered 2015-06-06: 2 mg via INTRAVENOUS
  Administered 2015-06-06 (×2): 1 mg via INTRAVENOUS

## 2015-06-06 MED ORDER — PROMETHAZINE HCL 25 MG/ML IJ SOLN: 25.0000 mg | Freq: Once | INTRAMUSCULAR | Status: DC

## 2015-06-06 MED ORDER — ONDANSETRON HCL 4 MG/2ML IJ SOLN
INTRAMUSCULAR | Status: AC
  Filled 2015-06-06: qty 2

## 2015-06-06 MED ORDER — LIDOCAINE VISCOUS 2 % MT SOLN
OROMUCOSAL | Status: DC | PRN
  Administered 2015-06-06: 1 via OROMUCOSAL

## 2015-06-06 MED ORDER — STERILE WATER FOR IRRIGATION IR SOLN
Status: DC | PRN
  Administered 2015-06-06: 10:00:00

## 2015-06-06 MED ORDER — MIDAZOLAM HCL 5 MG/5ML IJ SOLN
INTRAMUSCULAR | Status: AC
  Filled 2015-06-06: qty 10

## 2015-06-06 MED ORDER — ONDANSETRON HCL 4 MG/2ML IJ SOLN
INTRAMUSCULAR | Status: DC | PRN
  Administered 2015-06-06: 4 mg via INTRAVENOUS

## 2015-06-06 MED ORDER — PROMETHAZINE HCL 25 MG/ML IJ SOLN
INTRAMUSCULAR | Status: AC
  Administered 2015-06-06: 25 mg via INTRAVENOUS
  Filled 2015-06-06: qty 1

## 2015-06-06 MED ORDER — SODIUM CHLORIDE 0.9 % IV SOLN
INTRAVENOUS | Status: DC
  Administered 2015-06-06: 1000 mL via INTRAVENOUS

## 2015-06-06 MED ORDER — MEPERIDINE HCL 100 MG/ML IJ SOLN
INTRAMUSCULAR | Status: DC | PRN
  Administered 2015-06-06: 50 mg via INTRAVENOUS
  Administered 2015-06-06 (×3): 25 mg via INTRAVENOUS

## 2015-06-06 MED ORDER — MEPERIDINE HCL 100 MG/ML IJ SOLN
INTRAMUSCULAR | Status: AC
  Filled 2015-06-06: qty 2

## 2015-06-06 NOTE — Op Note (Signed)
[facility] [address] [place], [address]   COLONOSCOPY PROCEDURE REPORT  PATIENT: Andrew Shields, Andrew Shields  MR#: [id number] BIRTHDATE: [birth year] , 48  yrs. old GENDER: male ENDOSCOPIST: R.  [name], MD FACP [facility] REFERRED MW:NUUVO Caryl Never, M.D. PROCEDURE DATE:  06-20-15 PROCEDURE:   Colonoscopy, high risk screening INDICATIONS:High risk screening examination. MEDICATIONS: Versed 7 mg IV and Demerol 125 mg IV.  Zofran 4 mg IV.  ASA CLASS:       Class II  CONSENT: The risks, benefits, alternatives and imponderables including but not limited to bleeding, perforation as well as the possibility of a missed lesion have been reviewed.  The potential for biopsy, lesion removal, etc. have also been discussed. Questions have been answered.  All parties agreeable.  Please see the history and physical in the medical record for more information.  DESCRIPTION OF PROCEDURE:   After the risks benefits and alternatives of the procedure were thoroughly explained, informed consent was obtained.  The digital rectal exam      The EC-[id number] ([id number])  endoscope was introduced through the anus and advanced to the   . No adverse events experienced.   The quality of the prep was adequate  The instrument was then slowly withdrawn as the colon was fully examined. Estimated blood loss is zero unless otherwise noted in this procedure report.      COLON FINDINGS: Narrowed rectal vault.  Did not attempt to retroflex.  Rectal mucosa seen well on?"face ?" appeared normal. Normal-appearing colonic mucosa.  Retroflexion was not performed. .   Withdrawal time=8 minutes 0 seconds.  The scope was withdrawn and the procedure completed. COMPLICATIONS: There were no immediate complications.  ENDOSCOPIC IMPRESSION: Normal colonoscopy  RECOMMENDATIONS: Repeat examination in 5 years  eSigned:  R. [name], MD Jerrel Ivory [facility] 2015-06-20 11:16 AM   cc:  CPT CODES: ICD  CODES:  The ICD and CPT codes recommended by this software are interpretations from the data that the clinical staff has captured with the software.  The verification of the translation of this report to the ICD and CPT codes and modifiers is the sole responsibility of the health care institution and practicing physician where this report was generated.  [employer] will not be held responsible for the validity of the ICD and CPT codes included on this report.  AMA assumes no liability for data contained or not contained herein. CPT is a Publishing rights manager of the [employer].

## 2015-06-06 NOTE — Discharge Instructions (Signed)
Colonoscopy Discharge Instructions  Read the instructions outlined below and refer to this sheet in the next few weeks. These discharge instructions provide you with general information on caring for yourself after you leave the hospital. Your doctor may also give you specific instructions. While your treatment has been planned according to the most current medical practices available, unavoidable complications occasionally occur. If you have any problems or questions after discharge, call Dr. Gala Romney at [phone]. ACTIVITY  You may resume your regular activity, but move at a slower pace for the next 24 hours.   Take frequent rest periods for the next 24 hours.   Walking will help get rid of the air and reduce the bloated feeling in your belly (abdomen).   No driving for 24 hours (because of the medicine (anesthesia) used during the test).    Do not sign any important legal documents or operate any machinery for 24 hours (because of the anesthesia used during the test).  NUTRITION  Drink plenty of fluids.   You may resume your normal diet as instructed by your doctor.   Begin with a light meal and progress to your normal diet. Heavy or fried foods are harder to digest and may make you feel sick to your stomach (nauseated).   Avoid alcoholic beverages for 24 hours or as instructed.  MEDICATIONS  You may resume your normal medications unless your doctor tells you otherwise.  WHAT YOU CAN EXPECT TODAY  Some feelings of bloating in the abdomen.   Passage of more gas than usual.   Spotting of blood in your stool or on the toilet paper.  IF YOU HAD POLYPS REMOVED DURING THE COLONOSCOPY:  No aspirin products for 7 days or as instructed.   No alcohol for 7 days or as instructed.   Eat a soft diet for the next 24 hours.  FINDING OUT THE RESULTS OF YOUR TEST Not all test results are available during your visit. If your test results are not back during the visit, make an appointment  with your caregiver to find out the results. Do not assume everything is normal if you have not heard from your caregiver or the medical facility. It is important for you to follow up on all of your test results.  SEEK IMMEDIATE MEDICAL ATTENTION IF:  You have more than a spotting of blood in your stool.   Your belly is swollen (abdominal distention).   You are nauseated or vomiting.   You have a temperature over 101.   You have abdominal pain or discomfort that is severe or gets worse throughout the day.   EGD Discharge instructions Please read the instructions outlined below and refer to this sheet in the next few weeks. These discharge instructions provide you with general information on caring for yourself after you leave the hospital. Your doctor may also give you specific instructions. While your treatment has been planned according to the most current medical practices available, unavoidable complications occasionally occur. If you have any problems or questions after discharge, please call your doctor. ACTIVITY  You may resume your regular activity but move at a slower pace for the next 24 hours.   Take frequent rest periods for the next 24 hours.   Walking will help expel (get rid of) the air and reduce the bloated feeling in your abdomen.   No driving for 24 hours (because of the anesthesia (medicine) used during the test).   You may shower.   Do not sign any  important legal documents or operate any machinery for 24 hours (because of the anesthesia used during the test).  NUTRITION  Drink plenty of fluids.   You may resume your normal diet.   Begin with a light meal and progress to your normal diet.   Avoid alcoholic beverages for 24 hours or as instructed by your caregiver.  MEDICATIONS  You may resume your normal medications unless your caregiver tells you otherwise.  WHAT YOU CAN EXPECT TODAY  You may experience abdominal discomfort such as a feeling of  fullness or gas pains.  FOLLOW-UP  Your doctor will discuss the results of your test with you.  SEEK IMMEDIATE MEDICAL ATTENTION IF ANY OF THE FOLLOWING OCCUR:  Excessive nausea (feeling sick to your stomach) and/or vomiting.   Severe abdominal pain and distention (swelling).   Trouble swallowing.   Temperature over 101 F (37.8 C).   Rectal bleeding or vomiting of blood.     GERD information provided  Begin Protonix 40 mg daily  Office visit with [place] in 3 months  Repeat colonoscopy in 5 years  Further recommendations to follow pending review of pathology report    Gastroesophageal Reflux Disease, Adult Normally, food travels down the esophagus and stays in the stomach to be digested. However, when a person has gastroesophageal reflux disease (GERD), food and stomach acid move back up into the esophagus. When this happens, the esophagus becomes sore and inflamed. Over time, GERD can create small holes (ulcers) in the lining of the esophagus.  CAUSES This condition is caused by a problem with the muscle between the esophagus and the stomach (lower esophageal sphincter, or LES). Normally, the LES muscle closes after food passes through the esophagus to the stomach. When the LES is weakened or abnormal, it does not close properly, and that allows food and stomach acid to go back up into the esophagus. The LES can be weakened by certain dietary substances, medicines, and medical conditions, including:  Tobacco use.  Pregnancy.  Having a hiatal hernia.  Heavy alcohol use.  Certain foods and beverages, such as coffee, chocolate, onions, and peppermint. RISK FACTORS This condition is more likely to develop in:  People who have an increased body weight.  People who have connective tissue disorders.  People who use NSAID medicines. SYMPTOMS Symptoms of this condition include:  Heartburn.  Difficult or painful swallowing.  The feeling of having a lump in the  throat.  Abitter taste in the mouth.  Bad breath.  Having a large amount of saliva.  Having an upset or bloated stomach.  Belching.  Chest pain.  Shortness of breath or wheezing.  Ongoing (chronic) cough or a night-time cough.  Wearing away of tooth enamel.  Weight loss. Different conditions can cause chest pain. Make sure to see your health care provider if you experience chest pain. DIAGNOSIS Your health care provider will take a medical history and perform a physical exam. To determine if you have mild or severe GERD, your health care provider may also monitor how you respond to treatment. You may also have other tests, including:  An endoscopy toexamine your stomach and esophagus with a small camera.  A test thatmeasures the acidity level in your esophagus.  A test thatmeasures how much pressure is on your esophagus.  A barium swallow or modified barium swallow to show the shape, size, and functioning of your esophagus. TREATMENT The goal of treatment is to help relieve your symptoms and to prevent complications. Treatment for this  condition may vary depending on how severe your symptoms are. Your health care provider may recommend:  Changes to your diet.  Medicine.  Surgery. HOME CARE INSTRUCTIONS Diet  Follow a diet as recommended by your health care provider. This may involve avoiding foods and drinks such as:  Coffee and tea (with or without caffeine).  Drinks that containalcohol.  Energy drinks and sports drinks.  Carbonated drinks or sodas.  Chocolate and cocoa.  Peppermint and mint flavorings.  Garlic and onions.  Horseradish.  Spicy and acidic foods, including peppers, chili powder, curry powder, vinegar, hot sauces, and barbecue sauce.  Citrus fruit juices and citrus fruits, such as oranges, lemons, and limes.  Tomato-based foods, such as red sauce, chili, salsa, and pizza with red sauce.  Fried and fatty foods, such as donuts,  french fries, potato chips, and high-fat dressings.  High-fat meats, such as hot dogs and fatty cuts of red and white meats, such as rib eye steak, sausage, ham, and bacon.  High-fat dairy items, such as whole milk, butter, and cream cheese.  Eat small, frequent meals instead of large meals.  Avoid drinking large amounts of liquid with your meals.  Avoid eating meals during the 2-3 hours before bedtime.  Avoid lying down right after you eat.  Do not exercise right after you eat. General Instructions  Pay attention to any changes in your symptoms.  Take over-the-counter and prescription medicines only as told by your health care provider. Do not take aspirin, ibuprofen, or other NSAIDs unless your health care provider told you to do so.  Do not use any tobacco products, including cigarettes, chewing tobacco, and e-cigarettes. If you need help quitting, ask your health care provider.  Wear loose-fitting clothing. Do not wear anything tight around your waist that causes pressure on your abdomen.  Raise (elevate) the head of your bed 6 inches (15cm).  Try to reduce your stress, such as with yoga or meditation. If you need help reducing stress, ask your health care provider.  If you are overweight, reduce your weight to an amount that is healthy for you. Ask your health care provider for guidance about a safe weight loss goal.  Keep all follow-up visits as told by your health care provider. This is important. SEEK MEDICAL CARE IF:  You have new symptoms.  You have unexplained weight loss.  You have difficulty swallowing, or it hurts to swallow.  You have wheezing or a persistent cough.  Your symptoms do not improve with treatment.  You have a hoarse voice. SEEK IMMEDIATE MEDICAL CARE IF:  You have pain in your arms, neck, jaw, teeth, or back.  You feel sweaty, dizzy, or light-headed.  You have chest pain or shortness of breath.  You vomit and your vomit looks like  blood or coffee grounds.  You faint.  Your stool is bloody or black.  You cannot swallow, drink, or eat.   This information is not intended to replace advice given to you by your health care provider. Make sure you discuss any questions you have with your health care provider.   Document Released: 05/22/2005 Document Revised: 05/03/2015 Document Reviewed: 12/07/2014 Elsevier Interactive Patient Education [employer]

## 2015-06-06 NOTE — H&P (View-Only) (Signed)
Primary Care Physician:  [name], MD  Primary Gastroenterologist:  [name], MD   Chief Complaint  Patient presents with  . Dysphagia  . set up TCS    HPI:  Andrew Shields is a 49 y.o. male here for high-risk screening colonoscopy. His mother was diagnosed with colon cancer in her late 44s. Patient's last colonoscopy was in 2011, unremarkable.  Patient complains of several month history of solid food dysphagia. Symptoms are intermittent. Develops chest pressure when it occurs. No significant heartburn symptoms. Complains of intermittent nausea, especially when he is brushing his teeth. When he gets up the morning has to bring up a lot of phlegm. No abdominal pain. BM regular, every day. No melena, brbpr. Several times per day. No unintentional weight loss.  Current Outpatient Prescriptions  Medication Sig Dispense Refill  . DULoxetine (CYMBALTA) 30 MG capsule Take 30 mg by mouth daily.    . fish oil-omega-3 fatty acids 1000 MG capsule Take 2 g by mouth daily.      Marland Kitchen gemfibrozil (LOPID) 600 MG tablet TAKE 1 TABLET BY MOUTH TWICE DAILY BEFORE A MEAL. 180 tablet 0   No current facility-administered medications for this visit.    Allergies as of 05/10/2015 - Review Complete 05/10/2015  Allergen Reaction Noted  . Amoxicillin-pot clavulanate  03/21/2010    Past Medical History  Diagnosis Date  . Hyperlipidemia     Past Surgical History  Procedure Laterality Date  . Tonsillectomy    . Colonoscopy  04/19/2010    RMR: 1. normal rectum 2. normal colon. 3. Normal terminal ileum     Family History  Problem Relation Age of Onset  . Colon cancer Mother     age late 41s  . Heart disease Mother 44    stent  . Diabetes Mother     type ll  . Alcohol abuse Father   . Cancer Father     lung  . Hyperlipidemia Brother   . Diabetes Sister     ?type 2    Social History   Social History  . Marital Status: Married    Spouse Name: N/A  . Number of Children: 0  .  Years of Education: N/A   Occupational History  . Not on file.   Social History Main Topics  . Smoking status: Never Smoker   . Smokeless tobacco: Not on file  . Alcohol Use: No  . Drug Use: No  . Sexual Activity: Not on file   Other Topics Concern  . Not on file   Social History Narrative      ROS:  General: Negative for anorexia, weight loss, fever, chills, fatigue, weakness. Eyes: Negative for vision changes.  ENT: Negative for hoarseness, difficulty swallowing , nasal congestion. CV: Negative for chest pain, angina, palpitations, dyspnea on exertion, peripheral edema.  Respiratory: Negative for dyspnea at rest, dyspnea on exertion, cough, sputum, wheezing.  GI: See history of present illness. GU:  Negative for dysuria, hematuria, urinary incontinence, urinary frequency, nocturnal urination.  MS: Negative for joint pain, low back pain.  Derm: Negative for rash or itching.  Neuro: Negative for weakness, abnormal sensation, seizure, frequent headaches, memory loss, confusion.  Psych: Negative for anxiety, depression, suicidal ideation, hallucinations.  Endo: Negative for unusual weight change.  Heme: Negative for bruising or bleeding. Allergy: Negative for rash or hives.    Physical Examination:  BP 130/77 mmHg  Pulse 58  Temp(Src) 97.8 F (36.6 C)  Ht  (1.778 m)  Wt 231 lb 6.4 oz (104.962 kg)  BMI 33.20 kg/m2   General: Well-nourished, well-developed in no acute distress.  Head: Normocephalic, atraumatic.   Eyes: Conjunctiva pink, no icterus. Mouth: Oropharyngeal mucosa moist and pink , no lesions erythema or exudate. Neck: Supple without thyromegaly, masses, or lymphadenopathy.  Lungs: Clear to auscultation bilaterally.  Heart: Regular rate and rhythm, no murmurs rubs or gallops.  Abdomen: Bowel sounds are normal, nontender, nondistended, no hepatosplenomegaly or masses, no abdominal bruits or    hernia , no rebound or guarding.   Rectal: Not  performed Extremities: No lower extremity edema. No clubbing or deformities.  Neuro: Alert and oriented x 4 , grossly normal neurologically.  Skin: Warm and dry, no rash or jaundice.   Psych: Alert and cooperative, normal mood and affect.  Labs: Lab Results  Component Value Date   ALT 22 04/22/2014   AST 18 04/22/2014   ALKPHOS 53 04/22/2014   BILITOT 0.7 04/22/2014   Lab Results  Component Value Date   CREATININE 1.0 04/22/2014   BUN 18 04/22/2014   NA 142 04/22/2014   K 5.0 04/22/2014   CL 108 04/22/2014   CO2 26 04/22/2014   Lab Results  Component Value Date   TSH 1.61 04/22/2014     Imaging Studies: No results found.

## 2015-06-06 NOTE — Interval H&P Note (Signed)
History and Physical Interval Note:  06/06/2015 10:19 AM  Andrew Shields  has presented today for surgery, with the diagnosis of FAMILY HISTORY OF COLON CANCER/DYSPHAGIA  The various methods of treatment have been discussed with the patient and family. After consideration of risks, benefits and other options for treatment, the patient has consented to  Procedure(s) with comments: COLONOSCOPY (N/A) - 1030 - moved to 10:00 - office to notify ESOPHAGOGASTRODUODENOSCOPY (EGD) (N/A) ESOPHAGEAL DILATION (N/A) as a surgical intervention .  The patient's history has been reviewed, patient examined, no change in status, stable for surgery.  I have reviewed the patient's chart and labs.  Questions were answered to the patient's satisfaction.     Andrew Shields  No change. EGD and colonoscopy today per plan.  The risks, benefits, limitations, imponderables and alternatives regarding both EGD and colonoscopy have been reviewed with the patient. Questions have been answered. All parties agreeable.

## 2015-06-06 NOTE — Op Note (Signed)
[facility] [address] [place], [address]   ENDOSCOPY PROCEDURE REPORT  PATIENT: Andrew Shields, Andrew Shields  MR#: [id number] BIRTHDATE: 02-Oct-1965 , 48  yrs. old GENDER: male ENDOSCOPIST: R.  [name], MD FACP FACG REFERRED BY:  Evelena Peat, M.D. PROCEDURE DATE:  06-18-2015 PROCEDURE:  EGD with [name] dilation and gastric biopsy INDICATIONS:  Esophageal dysphagia. MEDICATIONS: Versed 5 mg IV and Demerol 100 mg IV in divided doses. Zofran 4 mg IV.  Xylocaine gel orally ASA CLASS:      Class II  CONSENT: The risks, benefits, limitations, alternatives and imponderables have been discussed.  The potential for biopsy, esophogeal dilation, etc. have also been reviewed.  Questions have been answered.  All parties agreeable.  Please see the history and physical in the medical record for more information.  DESCRIPTION OF PROCEDURE: After the risks benefits and alternatives of the procedure were thoroughly explained, informed consent was obtained.  The EG-[id number] ([id number]) endoscope was introduced through the mouth and advanced to the second portion of the duodenum , limited by Without limitations. The instrument was slowly withdrawn as the mucosa was fully examined. Estimated blood loss is zero unless otherwise noted in this procedure report.    3 quadrant distal esophageal erosions within 3 mm of the GE junction.  The tubular esophagus appeared patent throughout its course.  No Barrett's epithelium seen.  Stomach empty.  2 cm hiatal hernia present.  Focal antral erosions also present.  No ulcer or infiltrating process.  Patent pylorus.  Normal-appearing first and second portion of the duodenum.  Retroflexed views revealed a hiatal hernia. I attempted to pass a 24 [place] Maloney dilator empirically but I ran into acute resistance in the hypopharynx. Subsequently, I passed a 54 French Maloney dilator to full insertion with ease. A low back revealed no apparent  complication related to this maneuver. Finally, I biopsied abnormal antrum. The scope was then withdrawn from the patient and the procedure completed.  COMPLICATIONS: There were no immediate complications. EBL 3 mL ENDOSCOPIC IMPRESSION: Mild erosive reflux esophagitis. Hiatal hernia. Antral erosions. Status post [name] dilation followed by biopsy  RECOMMENDATIONS: Course of Protonix 40 mg daily. Follow up on pathology. See colonoscopy report. We don't  REPEAT EXAM:  eSigned:  R. [name], MD Jerrel [facility] 18-Jun-2015 10:53 AM    CC:  CPT CODES: ICD CODES:  The ICD and CPT codes recommended by this software are interpretations from the data that the clinical staff has captured with the software.  The verification of the translation of this report to the ICD and CPT codes and modifiers is the sole responsibility of the health care institution and practicing physician where this report was generated.  [employer] will not be held responsible for the validity of the ICD and CPT codes included on this report.  AMA assumes no liability for data contained or not contained herein. CPT is a Publishing rights manager of the [employer].

## 2015-06-11 ENCOUNTER — Encounter: Payer: Self-pay | Admitting: Internal Medicine

## 2015-06-12 ENCOUNTER — Encounter (HOSPITAL_COMMUNITY): Payer: Self-pay | Admitting: Internal Medicine

## 2015-06-28 ENCOUNTER — Other Ambulatory Visit (INDEPENDENT_AMBULATORY_CARE_PROVIDER_SITE_OTHER): Payer: PRIVATE HEALTH INSURANCE

## 2015-06-28 DIAGNOSIS — R7989 Other specified abnormal findings of blood chemistry: Secondary | ICD-10-CM | POA: Diagnosis not present

## 2015-06-28 DIAGNOSIS — Z0000 Encounter for general adult medical examination without abnormal findings: Secondary | ICD-10-CM

## 2015-06-28 LAB — CBC WITH DIFFERENTIAL/PLATELET
Basophils Absolute: 0 10*3/uL (ref 0.0–0.1)
Basophils Relative: 0.7 % (ref 0.0–3.0)
Eosinophils Absolute: 0.1 10*3/uL (ref 0.0–0.7)
Eosinophils Relative: 2.3 % (ref 0.0–5.0)
HCT: 46.1 % (ref 39.0–52.0)
Hemoglobin: 15.3 g/dL (ref 13.0–17.0)
Lymphocytes Relative: 43.4 % (ref 12.0–46.0)
Lymphs Abs: 2.7 10*3/uL (ref 0.7–4.0)
MCHC: 33.2 g/dL (ref 30.0–36.0)
MCV: 91.9 fl (ref 78.0–100.0)
Monocytes Absolute: 0.5 10*3/uL (ref 0.1–1.0)
Monocytes Relative: 8.3 % (ref 3.0–12.0)
Neutro Abs: 2.8 10*3/uL (ref 1.4–7.7)
Neutrophils Relative %: 45.3 % (ref 43.0–77.0)
Platelets: 262 10*3/uL (ref 150.0–400.0)
RBC: 5.01 Mil/uL (ref 4.22–5.81)
RDW: 13.6 % (ref 11.5–15.5)
WBC: 6.2 10*3/uL (ref 4.0–10.5)

## 2015-06-28 LAB — LIPID PANEL
Cholesterol: 233 mg/dL — ABNORMAL HIGH (ref 0–200)
HDL: 41 mg/dL (ref >39.00)
NonHDL: 191.84
Total CHOL/HDL Ratio: 6
Triglycerides: 254 mg/dL — ABNORMAL HIGH (ref 0.0–149.0)
VLDL: 50.8 mg/dL — ABNORMAL HIGH (ref 0.0–40.0)

## 2015-06-28 LAB — BASIC METABOLIC PANEL
BUN: 12 mg/dL (ref 6–23)
CO2: 30 mEq/L (ref 19–32)
Calcium: 10.1 mg/dL (ref 8.4–10.5)
Chloride: 104 mEq/L (ref 96–112)
Creatinine, Ser: 0.85 mg/dL (ref 0.40–1.50)
GFR: 101.01 mL/min (ref >60.00)
Glucose, Bld: 108 mg/dL — ABNORMAL HIGH (ref 70–99)
Potassium: 4.8 mEq/L (ref 3.5–5.1)
Sodium: 142 mEq/L (ref 135–145)

## 2015-06-28 LAB — HEPATIC FUNCTION PANEL
ALT: 20 U/L (ref 0–53)
AST: 17 U/L (ref 0–37)
Albumin: 4.4 g/dL (ref 3.5–5.2)
Alkaline Phosphatase: 61 U/L (ref 39–117)
Bilirubin, Direct: 0.1 mg/dL (ref 0.0–0.3)
Total Bilirubin: 0.6 mg/dL (ref 0.2–1.2)
Total Protein: 7 g/dL (ref 6.0–8.3)

## 2015-06-28 LAB — PSA: PSA: 0.37 ng/mL (ref 0.10–4.00)

## 2015-06-28 LAB — LDL CHOLESTEROL, DIRECT: Direct LDL: 158 mg/dL

## 2015-06-28 LAB — TSH: TSH: 2.16 u[IU]/mL (ref 0.35–4.50)

## 2015-07-05 ENCOUNTER — Ambulatory Visit (INDEPENDENT_AMBULATORY_CARE_PROVIDER_SITE_OTHER): Payer: PRIVATE HEALTH INSURANCE | Admitting: Family Medicine

## 2015-07-05 ENCOUNTER — Telehealth: Payer: Self-pay | Admitting: Family Medicine

## 2015-07-05 ENCOUNTER — Encounter: Payer: Self-pay | Admitting: Family Medicine

## 2015-07-05 VITALS — BP 117/80 | HR 103 | Temp 98.0°F | Resp 16 | Ht 67.0 in | Wt 232.8 lb

## 2015-07-05 DIAGNOSIS — Z23 Encounter for immunization: Secondary | ICD-10-CM

## 2015-07-05 DIAGNOSIS — Z0000 Encounter for general adult medical examination without abnormal findings: Secondary | ICD-10-CM | POA: Diagnosis not present

## 2015-07-05 DIAGNOSIS — R5383 Other fatigue: Secondary | ICD-10-CM

## 2015-07-05 DIAGNOSIS — R7303 Prediabetes: Secondary | ICD-10-CM | POA: Diagnosis not present

## 2015-07-05 DIAGNOSIS — E1165 Type 2 diabetes mellitus with hyperglycemia: Secondary | ICD-10-CM | POA: Insufficient documentation

## 2015-07-05 MED ORDER — GEMFIBROZIL 600 MG PO TABS: 600.0000 mg | ORAL_TABLET | Freq: Two times a day (BID) | ORAL | Status: DC

## 2015-07-05 NOTE — Progress Notes (Signed)
Pre visit review using our clinic review tool, if applicable. No additional management support is needed unless otherwise documented below in the visit note. 

## 2015-07-05 NOTE — Patient Instructions (Signed)
Fat and Cholesterol Restricted Diet High levels of fat and cholesterol in your blood may lead to various health problems, such as diseases of the heart, blood vessels, gallbladder, liver, and pancreas. Fats are concentrated sources of energy that come in various forms. Certain types of fat, including saturated fat, may be harmful in excess. Cholesterol is a substance needed by your body in small amounts. Your body makes all the cholesterol it needs. Excess cholesterol comes from the food you eat. When you have high levels of cholesterol and saturated fat in your blood, health problems can develop because the excess fat and cholesterol will gather along the walls of your blood vessels, causing them to narrow. Choosing the right foods will help you control your intake of fat and cholesterol. This will help keep the levels of these substances in your blood within normal limits and reduce your risk of disease. WHAT IS MY PLAN? Your health care provider recommends that you:  Get no more than __________ % of the total calories in your daily diet from fat.  Limit your intake of saturated fat to less than ______% of your total calories each day.  Limit the amount of cholesterol in your diet to less than _________mg per day. WHAT TYPES OF FAT SHOULD I CHOOSE?  Choose healthy fats more often. Choose monounsaturated and polyunsaturated fats, such as olive and canola oil, flaxseeds, walnuts, almonds, and seeds.  Eat more omega-3 fats. Good choices include salmon, mackerel, sardines, tuna, flaxseed oil, and ground flaxseeds. Aim to eat fish at least two times a week.  Limit saturated fats. Saturated fats are primarily found in animal products, such as meats, butter, and cream. Plant sources of saturated fats include palm oil, palm kernel oil, and coconut oil.  Avoid foods with partially hydrogenated oils in them. These contain trans fats. Examples of foods that contain trans fats are stick margarine, some tub  margarines, cookies, crackers, and other baked goods. WHAT GENERAL GUIDELINES DO I NEED TO FOLLOW? These guidelines for healthy eating will help you control your intake of fat and cholesterol:  Check food labels carefully to identify foods with trans fats or high amounts of saturated fat.  Fill one half of your plate with vegetables and green salads.  Fill one fourth of your plate with whole grains. Look for the word "whole" as the first word in the ingredient list.  Fill one fourth of your plate with lean protein foods.  Limit fruit to two servings a day. Choose fruit instead of juice.  Eat more foods that contain soluble fiber. Examples of foods that contain this type of fiber are apples, broccoli, carrots, beans, peas, and barley. Aim to get 20-30 g of fiber per day.  Eat more home-cooked food and less restaurant, buffet, and fast food.  Limit or avoid alcohol.  Limit foods high in starch and sugar.  Limit fried foods.  Cook foods using methods other than frying. Baking, boiling, grilling, and broiling are all great options.  Lose weight if you are overweight. Losing just 5-10% of your initial body weight can help your overall health and prevent diseases such as diabetes and heart disease. WHAT FOODS CAN I EAT? Grains Whole grains, such as whole wheat or whole grain breads, crackers, cereals, and pasta. Unsweetened oatmeal, bulgur, barley, quinoa, or brown rice. Corn or whole wheat flour tortillas. Vegetables Fresh or frozen vegetables (raw, steamed, roasted, or grilled). Green salads. Fruits All fresh, canned (in natural juice), or frozen fruits. Meat and  Other Protein Products Ground beef (85% or leaner), grass-fed beef, or beef trimmed of fat. Skinless chicken or [place]. Ground chicken or [place]. Pork trimmed of fat. All fish and seafood. Eggs. Dried beans, peas, or lentils. Unsalted nuts or seeds. Unsalted canned or dry beans. Dairy Low-fat dairy products, such as skim or  1% milk, 2% or reduced-fat cheeses, low-fat ricotta or cottage cheese, or plain low-fat yogurt. Fats and Oils Tub margarines without trans fats. Light or reduced-fat mayonnaise and salad dressings. Avocado. Olive, canola, sesame, or safflower oils. Natural peanut or almond butter (choose ones without added sugar and oil). The items listed above may not be a complete list of recommended foods or beverages. Contact your dietitian for more options. WHAT FOODS ARE NOT RECOMMENDED? Grains White bread. White pasta. White rice. Cornbread. Bagels, pastries, and croissants. Crackers that contain trans fat. Vegetables White potatoes. Corn. Creamed or fried vegetables. Vegetables in a cheese sauce. Fruits Dried fruits. Canned fruit in light or heavy syrup. Fruit juice. Meat and Other Protein Products Fatty cuts of meat. Ribs, chicken wings, bacon, sausage, bologna, salami, chitterlings, fatback, hot dogs, bratwurst, and packaged luncheon meats. Liver and organ meats. Dairy Whole or 2% milk, cream, half-and-half, and cream cheese. Whole milk cheeses. Whole-fat or sweetened yogurt. Full-fat cheeses. Nondairy creamers and whipped toppings. Processed cheese, cheese spreads, or cheese curds. Sweets and Desserts Corn syrup, sugars, honey, and molasses. Candy. Jam and jelly. Syrup. Sweetened cereals. Cookies, pies, cakes, donuts, muffins, and ice cream. Fats and Oils Butter, stick margarine, lard, shortening, ghee, or bacon fat. Coconut, palm kernel, or palm oils. Beverages Alcohol. Sweetened drinks (such as sodas, lemonade, and fruit drinks or punches). The items listed above may not be a complete list of foods and beverages to avoid. Contact your dietitian for more information.   This information is not intended to replace advice given to you by your health care provider. Make sure you discuss any questions you have with your health care provider.   Document Released: 08/12/2005 Document Revised: 09/02/2014  Document Reviewed: 11/10/2013 Elsevier Interactive Patient Education 2016 [place]. Hyperglycemia Hyperglycemia occurs when the glucose (sugar) in your blood is too high. Hyperglycemia can happen for many reasons, but it most often happens to people who do not know they have diabetes or are not managing their diabetes properly.  CAUSES  Whether you have diabetes or not, there are other causes of hyperglycemia. Hyperglycemia can occur when you have diabetes, but it can also occur in other situations that you might not be as aware of, such as: Diabetes  If you have diabetes and are having problems controlling your blood glucose, hyperglycemia could occur because of some of the following reasons:  Not following your meal plan.  Not taking your diabetes medications or not taking it properly.  Exercising less or doing less activity than you normally do.  Being sick. Pre-diabetes  This cannot be ignored. Before people develop Type 2 diabetes, they almost always have "pre-diabetes." This is when your blood glucose levels are higher than normal, but not yet high enough to be diagnosed as diabetes. Research has shown that some long-term damage to the body, especially the heart and circulatory system, may already be occurring during pre-diabetes. If you take action to manage your blood glucose when you have pre-diabetes, you may delay or prevent Type 2 diabetes from developing. Stress  If you have diabetes, you may be "diet" controlled or on oral medications or insulin to control your diabetes. However, you  may find that your blood glucose is higher than usual in the hospital whether you have diabetes or not. This is often referred to as "stress hyperglycemia." Stress can elevate your blood glucose. This happens because of hormones put out by the body during times of stress. If stress has been the cause of your high blood glucose, it can be followed regularly by your caregiver. That way he/she can  make sure your hyperglycemia does not continue to get worse or progress to diabetes. Steroids  Steroids are medications that act on the infection fighting system (immune system) to block inflammation or infection. One side effect can be a rise in blood glucose. Most people can produce enough extra insulin to allow for this rise, but for those who cannot, steroids make blood glucose levels go even higher. It is not unusual for steroid treatments to "uncover" diabetes that is developing. It is not always possible to determine if the hyperglycemia will go away after the steroids are stopped. A special blood test called an A1c is sometimes done to determine if your blood glucose was elevated before the steroids were started. SYMPTOMS  Thirsty.  Frequent urination.  Dry mouth.  Blurred vision.  Tired or fatigue.  Weakness.  Sleepy.  Tingling in feet or leg. DIAGNOSIS  Diagnosis is made by monitoring blood glucose in one or all of the following ways:  A1c test. This is a chemical found in your blood.  Fingerstick blood glucose monitoring.  Laboratory results. TREATMENT  First, knowing the cause of the hyperglycemia is important before the hyperglycemia can be treated. Treatment may include, but is not be limited to:  Education.  Change or adjustment in medications.  Change or adjustment in meal plan.  Treatment for an illness, infection, etc.  More frequent blood glucose monitoring.  Change in exercise plan.  Decreasing or stopping steroids.  Lifestyle changes. HOME CARE INSTRUCTIONS   Test your blood glucose as directed.  Exercise regularly. Your caregiver will give you instructions about exercise. Pre-diabetes or diabetes which comes on with stress is helped by exercising.  Eat wholesome, balanced meals. Eat often and at regular, fixed times. Your caregiver or nutritionist will give you a meal plan to guide your sugar intake.  Being at an ideal weight is important.  If needed, losing as little as 10 to 15 pounds may help improve blood glucose levels. SEEK MEDICAL CARE IF:   You have questions about medicine, activity, or diet.  You continue to have symptoms (problems such as increased thirst, urination, or weight gain). SEEK IMMEDIATE MEDICAL CARE IF:   You are vomiting or have diarrhea.  Your breath smells fruity.  You are breathing faster or slower.  You are very sleepy or incoherent.  You have numbness, tingling, or pain in your feet or hands.  You have chest pain.  Your symptoms get worse even though you have been following your caregiver's orders.  If you have any other questions or concerns.   This information is not intended to replace advice given to you by your health care provider. Make sure you discuss any questions you have with your health care provider.   Document Released: 02/05/2001 Document Revised: 11/04/2011 Document Reviewed: 04/18/2015 Elsevier Interactive Patient Education [employer] Inc[employer]

## 2015-07-05 NOTE — Telephone Encounter (Signed)
Rx sent to pharmacy   

## 2015-07-05 NOTE — Telephone Encounter (Signed)
Pt was seen today and needs refill on gemfibrozil 600 mg #180 w/refills send to [facility] in [place]

## 2015-07-05 NOTE — Progress Notes (Signed)
Subjective:    Patient ID: Andrew Shields, male    DOB: Apr 26, 1966, 49 y.o.   MRN: [id number]  HPI Patient seen for complete physical. His chronic problems include history of obesity, dyslipidemia with predominantly high triglycerides, prediabetes, chronic testicular pain. He had some dysphagia recently and underwent EGD with apparently small stricture and some mild esophagitis changes. His symptoms are improved following dilatation and he is maintained on low-dose Protonix. He takes gemfibrozil for his high triglycerides. He was started on low-dose Cymbalta for his chronic testicle pain and that has helped. This has been followed by urology  Patient is nonsmoker. He works as a Emergency planning/management officer. Hopes to retire in 3 years. No consistent exercise. Complains of some fatigue issues. Requesting testosterone level. Also has sometimes low libido.  Family history significant for type 2 diabetes in his mother. His mother also had colon cancer. Patient had recent colonoscopy screening.  Past Medical History  Diagnosis Date  . Hyperlipidemia    Past Surgical History  Procedure Laterality Date  . Tonsillectomy    . Colonoscopy  04/19/2010    RMR: 1. normal rectum 2. normal colon. 3. Normal terminal ileum   . Colonoscopy N/A 06/06/2015    Procedure: COLONOSCOPY;  Surgeon: Corbin Ade, MD;  [facility];  Service: Endoscopy;  Laterality: N/A;  1030 - moved to 10:00 - office to notify  . Esophagogastroduodenoscopy N/A 06/06/2015    Procedure: ESOPHAGOGASTRODUODENOSCOPY (EGD);  Surgeon: Corbin Ade, MD;  [facility];  Service: Endoscopy;  Laterality: N/A;  . Esophageal dilation N/A 06/06/2015    Procedure: ESOPHAGEAL DILATION;  Surgeon: Corbin Ade, MD;  [facility];  Service: Endoscopy;  Laterality: N/A;    reports that he has never smoked. He does not have any smokeless tobacco history on file. He reports that he does not drink alcohol or use illicit  drugs. family history includes Alcohol abuse in his father; Cancer in his father; Colon cancer in his mother; Diabetes in his mother and sister; Heart disease (age of onset: 87) in his mother; Hyperlipidemia in his brother. Allergies  Allergen Reactions  . Amoxicillin-Pot Clavulanate     REACTION: difficulty breathing      Review of Systems  Constitutional: Negative for fever, activity change, appetite change, fatigue and unexpected weight change.  HENT: Negative for congestion, ear pain and trouble swallowing.   Eyes: Negative for pain and visual disturbance.  Respiratory: Negative for cough, shortness of breath and wheezing.   Cardiovascular: Negative for chest pain and palpitations.  Gastrointestinal: Negative for nausea, vomiting, abdominal pain, diarrhea, constipation, blood in stool, abdominal distention and rectal pain.  Endocrine: Negative for polydipsia and polyuria.  Genitourinary: Negative for dysuria, hematuria and testicular pain.  Musculoskeletal: Negative for joint swelling and arthralgias.  Skin: Negative for rash.  Neurological: Negative for dizziness, syncope and headaches.  Hematological: Negative for adenopathy.  Psychiatric/Behavioral: Negative for confusion and dysphoric mood.       Objective:   Physical Exam  Constitutional: He is oriented to person, place, and time. He appears well-developed and well-nourished. No distress.  HENT:  Head: Normocephalic and atraumatic.  Right Ear: External ear normal.  Left Ear: External ear normal.  Mouth/Throat: Oropharynx is clear and moist.  Eyes: Conjunctivae and EOM are normal. Pupils are equal, round, and reactive to light.  Neck: Normal range of motion. Neck supple. No thyromegaly present.  Cardiovascular: Normal rate, regular rhythm and normal heart sounds.   No  murmur heard. Pulmonary/Chest: No respiratory distress. He has no wheezes. He has no rales.  Abdominal: Soft. Bowel sounds are normal. He exhibits no  distension and no mass. There is no tenderness. There is no rebound and no guarding.  Musculoskeletal: He exhibits no edema.  Lymphadenopathy:    He has no cervical adenopathy.  Neurological: He is alert and oriented to person, place, and time. He displays normal reflexes. No cranial nerve deficit.  Skin: No rash noted.  Psychiatric: He has a normal mood and affect.          Assessment & Plan:  Complete physical. Labs reviewed. He has high triglycerides that are stable. High cholesterol with elevated LDL. 10 year risk of CAD event 2.5%. Flu vaccine given. We recommended more consistent exercise and weight loss. Recent colonoscopy as above.  He will return for early-morning testosterone level

## 2015-07-10 ENCOUNTER — Other Ambulatory Visit (INDEPENDENT_AMBULATORY_CARE_PROVIDER_SITE_OTHER): Payer: PRIVATE HEALTH INSURANCE

## 2015-07-10 DIAGNOSIS — R5383 Other fatigue: Secondary | ICD-10-CM

## 2015-07-10 DIAGNOSIS — R7989 Other specified abnormal findings of blood chemistry: Secondary | ICD-10-CM

## 2015-07-10 LAB — TESTOSTERONE: Testosterone: 288.14 ng/dL — ABNORMAL LOW (ref 300.00–890.00)

## 2015-07-26 ENCOUNTER — Telehealth: Payer: Self-pay | Admitting: Family Medicine

## 2015-07-26 NOTE — Telephone Encounter (Signed)
Who would handle this?

## 2015-07-26 NOTE — Telephone Encounter (Signed)
Mr. Orson SlickBowman called saying [employer] told him they won't cover the CPE he had on 1011.9.16 due to not having the necessary codes listed in the paperwork they received. He'd like a phone call regarding this so he'll know when this has been taken care of. Please call the pt.  Pt ph# [phone] Thank you.

## 2015-07-27 NOTE — Telephone Encounter (Signed)
Left a message to return call.  

## 2015-07-27 NOTE — Telephone Encounter (Signed)
Noted  

## 2015-07-27 NOTE — Telephone Encounter (Signed)
Pt is aware that the billing was being processed.

## 2015-08-24 ENCOUNTER — Encounter: Payer: Self-pay | Admitting: Family Medicine

## 2015-08-24 ENCOUNTER — Other Ambulatory Visit (INDEPENDENT_AMBULATORY_CARE_PROVIDER_SITE_OTHER): Payer: PRIVATE HEALTH INSURANCE

## 2015-08-24 ENCOUNTER — Ambulatory Visit (INDEPENDENT_AMBULATORY_CARE_PROVIDER_SITE_OTHER): Payer: PRIVATE HEALTH INSURANCE | Admitting: Family Medicine

## 2015-08-24 VITALS — BP 100/80 | HR 97 | Temp 98.3°F | Ht 67.0 in | Wt 233.0 lb

## 2015-08-24 DIAGNOSIS — R202 Paresthesia of skin: Secondary | ICD-10-CM | POA: Diagnosis not present

## 2015-08-24 DIAGNOSIS — E291 Testicular hypofunction: Secondary | ICD-10-CM | POA: Diagnosis not present

## 2015-08-24 DIAGNOSIS — R200 Anesthesia of skin: Secondary | ICD-10-CM

## 2015-08-24 DIAGNOSIS — R7989 Other specified abnormal findings of blood chemistry: Secondary | ICD-10-CM

## 2015-08-24 NOTE — Patient Instructions (Signed)

## 2015-08-24 NOTE — Progress Notes (Signed)
   Subjective:    Patient ID: Andrew Shields, male    DOB: 1966/05/01, 49 y.o.   MRN: [id number]  HPI  Acute visit for right upper extremity numbness and weakness Onset about a week ago. Past history of carpal tunnel surgery about 2012. No recent injury. Denies any neck pain. Numbness involves most of the hand and forearm. Question mild weakness with gripping. Denies any wrist pain. No alleviating or aggravating factors.  Denies any right leg symptoms. No slurred speech. No facial weakness. Right-hand dominant.  Past Medical History  Diagnosis Date  . Hyperlipidemia    Past Surgical History  Procedure Laterality Date  . Tonsillectomy    . Colonoscopy  04/19/2010    RMR: 1. normal rectum 2. normal colon. 3. Normal terminal ileum   . Colonoscopy N/A 06/06/2015    Procedure: COLONOSCOPY;  Surgeon: Corbin Adeobert M Rourk, MD;  [facility];  Service: Endoscopy;  Laterality: N/A;  1030 - moved to 10:00 - office to notify  . Esophagogastroduodenoscopy N/A 06/06/2015    Procedure: ESOPHAGOGASTRODUODENOSCOPY (EGD);  Surgeon: Corbin Adeobert M Rourk, MD;  [facility];  Service: Endoscopy;  Laterality: N/A;  . Esophageal dilation N/A 06/06/2015    Procedure: ESOPHAGEAL DILATION;  Surgeon: Corbin Adeobert M Rourk, MD;  [facility];  Service: Endoscopy;  Laterality: N/A;    reports that he has never smoked. He does not have any smokeless tobacco history on file. He reports that he does not drink alcohol or use illicit drugs. family history includes Alcohol abuse in his father; Cancer in his father; Colon cancer in his mother; Diabetes in his mother and sister; Heart disease (age of onset: 1660) in his mother; Hyperlipidemia in his brother. Allergies  Allergen Reactions  . Amoxicillin-Pot Clavulanate     REACTION: difficulty breathing     Review of Systems  Respiratory: Negative for shortness of breath.   Cardiovascular: Negative for chest pain.  Neurological: Positive for  weakness and numbness. Negative for dizziness and headaches.       Objective:   Physical Exam  Constitutional: He is oriented to person, place, and time. He appears well-developed and well-nourished. No distress.  Cardiovascular: Normal rate and regular rhythm.   Pulmonary/Chest: Effort normal and breath sounds normal. No respiratory distress. He has no wheezes. He has no rales.  Neurological: He is alert and oriented to person, place, and time.  Full strength upper extremities. Symmetric reflexes. Sensory intact to touch and with monofilament          Assessment & Plan:  Right upper extremity numbness mostly involving the hand. Question carpal tunnel syndrome. Doubt central origin. Doubt cervical disc disease as he has no radiculopathy symptoms or neck pain whatsoever. Recommend nighttime wrist splint and touch base 2-3 weeks if not improving. Consider nerve conduction test then if not improved further

## 2015-08-25 LAB — TESTOSTERONE, FREE, TOTAL, SHBG
Sex Hormone Binding: 71 nmol/L — ABNORMAL HIGH (ref 10–50)
Testosterone, Free: 48.8 pg/mL (ref 47.0–244.0)
Testosterone-% Free: 1.2 % — ABNORMAL LOW (ref 1.6–2.9)
Testosterone: 411 ng/dL (ref 300–890)

## 2015-09-06 ENCOUNTER — Ambulatory Visit: Payer: PRIVATE HEALTH INSURANCE | Admitting: Gastroenterology

## 2015-09-20 ENCOUNTER — Encounter: Payer: Self-pay | Admitting: Gastroenterology

## 2015-09-20 ENCOUNTER — Encounter: Payer: Self-pay | Admitting: Internal Medicine

## 2015-09-20 ENCOUNTER — Ambulatory Visit (INDEPENDENT_AMBULATORY_CARE_PROVIDER_SITE_OTHER): Payer: PRIVATE HEALTH INSURANCE | Admitting: Gastroenterology

## 2015-09-20 VITALS — BP 130/77 | HR 73 | Temp 98.4°F | Ht 70.0 in | Wt 235.6 lb

## 2015-09-20 DIAGNOSIS — K208 Other esophagitis: Secondary | ICD-10-CM

## 2015-09-20 DIAGNOSIS — K2210 Ulcer of esophagus without bleeding: Secondary | ICD-10-CM

## 2015-09-20 MED ORDER — PANTOPRAZOLE SODIUM 40 MG PO TBEC: 40.0000 mg | DELAYED_RELEASE_TABLET | Freq: Two times a day (BID) | ORAL | Status: DC

## 2015-09-20 NOTE — Patient Instructions (Signed)
I have sent in a prescription for Protonix 40 mg twice a day. Take this 30 minutes before breakfast and dinner for one month, then decrease to once daily.   I will see you back in 3 months. Let me know if you have worsening of your symptoms or no change with this new regimen.

## 2015-09-20 NOTE — Progress Notes (Signed)
CC'ED TO PCP 

## 2015-09-20 NOTE — Progress Notes (Signed)
Referring Provider: [name], MD Primary Care Physician:  [name], MD Primary GI: Dr. Jena Gauss   Chief Complaint  Patient presents with  . Follow-up    HPI:   Andrew Shields is a 50 y.o. male presenting today with a history of erosive esophagitis, recently noted on EGD a few months ago. Empiric dilation performed at time of EGD. High risk screening colonoscopy completed due to family history in mother in her 74s, which was normal. Will need high risk screening in 5 years. Presents today in routine follow-up after procedures.   While brushing teeth, starts gagging. Sometimes feels nauseated after eating but improved from what it was prior to EGD. Only when brushing teeth starts to gag. Dysphagia resolved. Unclear if on Protonix 20 mg or 40 mg. Patient is unsure. No weight loss. No abdominal pain.   Past Medical History  Diagnosis Date  . Hyperlipidemia     Past Surgical History  Procedure Laterality Date  . Tonsillectomy    . Colonoscopy  04/19/2010    RMR: 1. normal rectum 2. normal colon. 3. Normal terminal ileum   . Colonoscopy N/A 06/06/2015    Dr. Elmer Ramp. Surveillance in 5 years   . Esophagogastroduodenoscopy N/A 06/06/2015    Dr. Dellie Catholic erosive reflux/HH/s/p empiric dilation  . Esophageal dilation N/A 06/06/2015    Procedure: ESOPHAGEAL DILATION;  Surgeon: Corbin Ade, MD;  [facility];  Service: Endoscopy;  Laterality: N/A;    Current Outpatient Prescriptions  Medication Sig Dispense Refill  . DULoxetine (CYMBALTA) 30 MG capsule Take 30 mg by mouth daily.    . fish oil-omega-3 fatty acids 1000 MG capsule Take 2 g by mouth daily.      Marland Kitchen gemfibrozil (LOPID) 600 MG tablet Take 1 tablet (600 mg total) by mouth 2 (two) times daily before a meal. 180 tablet 3  . pantoprazole (PROTONIX) 40 MG tablet Take 1 tablet (40 mg total) by mouth 2 (two) times daily before a meal. 180 tablet 3   No current facility-administered  medications for this visit.    Allergies as of 09/20/2015 - Review Complete 09/20/2015  Allergen Reaction Noted  . Amoxicillin-pot clavulanate  03/21/2010    Family History  Problem Relation Age of Onset  . Colon cancer Mother     age late 70s  . Heart disease Mother 47    stent  . Diabetes Mother     type ll  . Alcohol abuse Father   . Cancer Father     lung  . Hyperlipidemia Brother   . Diabetes Sister     ?type 2    Social History   Social History  . Marital Status: Married    Spouse Name: N/A  . Number of Children: 0  . Years of Education: N/A   Social History Main Topics  . Smoking status: Never Smoker   . Smokeless tobacco: None  . Alcohol Use: No  . Drug Use: No  . Sexual Activity: Not Asked   Other Topics Concern  . None   Social History Narrative    Review of Systems: As noted in HPI.   Physical Exam: BP 130/77 mmHg  Pulse 73  Temp(Src) 98.4 F (36.9 C) (Oral)  Ht  (1.778 m)  Wt 235 lb 9.6 oz (106.867 kg)  BMI 33.80 kg/m2 General:   Alert and oriented. No distress noted. Pleasant and cooperative.  Head:  Normocephalic and atraumatic. Eyes:  Conjuctiva clear without  scleral icterus. Abdomen:  +BS, soft, non-tender and non-distended. No rebound or guarding. No HSM or masses noted. Msk:  Symmetrical without gross deformities. Normal posture. Extremities:  Without edema. Neurologic:  Alert and  oriented x4;  grossly normal neurologically. Psych:  Alert and cooperative. Normal mood and affect.

## 2015-09-20 NOTE — Assessment & Plan Note (Signed)
50 year old with resolution of dysphagia after empiric dilation. He notes vague nausea at times after eating. Gallbladder remains in situ although highly unlikely this is biliary in nature. Likely secondary to GERD/gastritis. It is unclear if he is on Protonix 20 mg or 40 mg. Regardless, I have increased Protonix to 40 mg BID for ONE MONTH, then resuming once daily. Gagging while brushing teeth likely due to sensitive gag reflex. Will monitor symptoms closely and pursue further work-up if persistent symptoms of nausea despite PPI dosing. Return in 3 months. Next high risk screening colonoscopy in 2021.

## 2015-12-19 ENCOUNTER — Ambulatory Visit: Payer: PRIVATE HEALTH INSURANCE | Admitting: Gastroenterology

## 2016-02-27 ENCOUNTER — Emergency Department (HOSPITAL_COMMUNITY): Payer: Worker's Compensation

## 2016-02-27 ENCOUNTER — Emergency Department (HOSPITAL_COMMUNITY)
Admission: EM | Admit: 2016-02-27 | Discharge: 2016-02-27 | Disposition: A | Discharge status: Home / Self Care | Payer: Worker's Compensation | Attending: Emergency Medicine | Admitting: Emergency Medicine

## 2016-02-27 ENCOUNTER — Encounter (HOSPITAL_COMMUNITY): Payer: Self-pay | Admitting: *Deleted

## 2016-02-27 DIAGNOSIS — M25571 Pain in right ankle and joints of right foot: Secondary | ICD-10-CM | POA: Diagnosis not present

## 2016-02-27 DIAGNOSIS — E785 Hyperlipidemia, unspecified: Secondary | ICD-10-CM | POA: Insufficient documentation

## 2016-02-27 DIAGNOSIS — Z79899 Other long term (current) drug therapy: Secondary | ICD-10-CM | POA: Diagnosis not present

## 2016-02-27 MED ORDER — IBUPROFEN 400 MG PO TABS
600.0000 mg | ORAL_TABLET | Freq: Once | ORAL | Status: AC
  Administered 2016-02-27: 600 mg via ORAL
  Filled 2016-02-27: qty 2
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
## 2016-02-27 NOTE — Discharge Instructions (Signed)
X-rays of your foot and ankle were negative for any acute abnormalities or fractures. This is likely a ankle sprain. Rest your ankle, apply ice and elevate. Be sure to wear the brace anytime you are on your feet but you do not need to wear it to bed. Follow-up with your primary care physician within 2-3 days to have your ankle reevaluated and if your symptoms are not improving. Take over-the-counter Advil or Aleve as needed for pain. Be sure to eat while taking these medications as they are hard on your stomach.   Return to the emergency department if you experience increased pain, swelling, inability to walk on your foot, numbness/tingling of your toes or foot, or weakness.   Cryotherapy Cryotherapy is when you put ice on your injury. Ice helps lessen pain and puffiness (swelling) after an injury. Ice works the best when you start using it in the first 24 to 48 hours after an injury. HOME CARE  Put a dry or damp towel between the ice pack and your skin.  You may press gently on the ice pack.  Leave the ice on for no more than 10 to 20 minutes at a time.  Check your skin after 5 minutes to make sure your skin is okay.  Rest at least 20 minutes between ice pack uses.  Stop using ice when your skin loses feeling (numbness).  Do not use ice on someone who cannot tell you when it hurts. This includes small children and people with memory problems (dementia). GET HELP RIGHT AWAY IF:  You have white spots on your skin.  Your skin turns blue or pale.  Your skin feels waxy or hard.  Your puffiness gets worse. MAKE SURE YOU:   Understand these instructions.  Will watch your condition.  Will get help right away if you are not doing well or get worse.   This information is not intended to replace advice given to you by your health care provider. Make sure you discuss any questions you have with your health care provider.   Document Released: 01/29/2008 Document Revised: 11/04/2011  Document Reviewed: 04/04/2011 Elsevier Interactive Patient Education 2016 Elsevier Inc.  Ankle Pain Ankle pain is a common symptom. The bones, cartilage, tendons, and muscles of the ankle joint perform a lot of work each day. The ankle joint holds your body weight and allows you to move around. Ankle pain can occur on either side or back of 1 or both ankles. Ankle pain may be sharp and burning or dull and aching. There may be tenderness, stiffness, redness, or warmth around the ankle. The pain occurs more often when a person walks or puts pressure on the ankle. CAUSES  There are many reasons ankle pain can develop. It is important to work with your caregiver to identify the cause since many conditions can impact the bones, cartilage, muscles, and tendons. Causes for ankle pain include:  Injury, including a break (fracture), sprain, or strain often due to a fall, sports, or a high-impact activity.  Swelling (inflammation) of a tendon (tendonitis).  Achilles tendon rupture.  Ankle instability after repeated sprains and strains.  Poor foot alignment.  Pressure on a nerve (tarsal tunnel syndrome).  Arthritis in the ankle or the lining of the ankle.  Crystal formation in the ankle (gout or pseudogout). DIAGNOSIS  A diagnosis is based on your medical history, your symptoms, results of your physical exam, and results of diagnostic tests. Diagnostic tests may include X-ray exams or a computerized  magnetic scan (magnetic resonance imaging, MRI). TREATMENT  Treatment will depend on the cause of your ankle pain and may include:  Keeping pressure off the ankle and limiting activities.  Using crutches or other walking support (a cane or brace).  Using rest, ice, compression, and elevation.  Participating in physical therapy or home exercises.  Wearing shoe inserts or special shoes.  Losing weight.  Taking medications to reduce pain or swelling or receiving an injection.  Undergoing  surgery. HOME CARE INSTRUCTIONS   Only take over-the-counter or prescription medicines for pain, discomfort, or fever as directed by your caregiver.  Put ice on the injured area.  Put ice in a plastic bag.  Place a towel between your skin and the bag.  Leave the ice on for 15-20 minutes at a time, 03-04 times a day.  Keep your leg raised (elevated) when possible to lessen swelling.  Avoid activities that cause ankle pain.  Follow specific exercises as directed by your caregiver.  Record how often you have ankle pain, the location of the pain, and what it feels like. This information may be helpful to you and your caregiver.  Ask your caregiver about returning to work or sports and whether you should drive.  Follow up with your caregiver for further examination, therapy, or testing as directed. SEEK MEDICAL CARE IF:   Pain or swelling continues or worsens beyond 1 week.  You have an oral temperature above 102 F (38.9 C).  You are feeling unwell or have chills.  You are having an increasingly difficult time with walking.  You have loss of sensation or other new symptoms.  You have questions or concerns. MAKE SURE YOU:   Understand these instructions.  Will watch your condition.  Will get help right away if you are not doing well or get worse.   This information is not intended to replace advice given to you by your health care provider. Make sure you discuss any questions you have with your health care provider.   Document Released: 01/30/2010 Document Revised: 11/04/2011 Document Reviewed: 03/14/2015 Elsevier Interactive Patient Education [employer]2016 Elsevier Inc.

## 2016-03-01 ENCOUNTER — Ambulatory Visit (INDEPENDENT_AMBULATORY_CARE_PROVIDER_SITE_OTHER): Payer: Worker's Compensation | Admitting: Family Medicine

## 2016-03-01 ENCOUNTER — Encounter: Payer: Self-pay | Admitting: Family Medicine

## 2016-03-01 VITALS — BP 105/66 | HR 54 | Temp 97.5°F | Ht 70.0 in | Wt 230.2 lb

## 2016-03-01 DIAGNOSIS — S93401D Sprain of unspecified ligament of right ankle, subsequent encounter: Secondary | ICD-10-CM

## 2016-03-01 NOTE — Patient Instructions (Signed)
Great to meet you!  Come back sometime between July 17-19
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
## 2016-03-12 ENCOUNTER — Ambulatory Visit (INDEPENDENT_AMBULATORY_CARE_PROVIDER_SITE_OTHER): Payer: Worker's Compensation | Admitting: Family Medicine

## 2016-03-12 ENCOUNTER — Ambulatory Visit (INDEPENDENT_AMBULATORY_CARE_PROVIDER_SITE_OTHER): Payer: Worker's Compensation

## 2016-03-12 ENCOUNTER — Encounter: Payer: Self-pay | Admitting: Family Medicine

## 2016-03-12 VITALS — BP 117/66 | HR 68 | Temp 97.9°F | Ht 70.0 in | Wt 237.4 lb

## 2016-03-12 DIAGNOSIS — M25571 Pain in right ankle and joints of right foot: Secondary | ICD-10-CM | POA: Diagnosis not present

## 2016-03-12 NOTE — Patient Instructions (Signed)
Great to see you!  Lets see you again in 2 weeks.
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
## 2016-03-16 DIAGNOSIS — S0501XA Injury of conjunctiva and corneal abrasion without foreign body, right eye, initial encounter: Secondary | ICD-10-CM | POA: Diagnosis not present

## 2016-03-16 DIAGNOSIS — H578 Other specified disorders of eye and adnexa: Secondary | ICD-10-CM | POA: Diagnosis not present

## 2016-03-16 DIAGNOSIS — T1501XA Foreign body in cornea, right eye, initial encounter: Secondary | ICD-10-CM | POA: Diagnosis not present

## 2016-03-18 DIAGNOSIS — H578 Other specified disorders of eye and adnexa: Secondary | ICD-10-CM | POA: Diagnosis not present

## 2016-03-19 DIAGNOSIS — H578 Other specified disorders of eye and adnexa: Secondary | ICD-10-CM | POA: Diagnosis not present

## 2016-03-20 DIAGNOSIS — H578 Other specified disorders of eye and adnexa: Secondary | ICD-10-CM | POA: Diagnosis not present

## 2016-03-21 DIAGNOSIS — H578 Other specified disorders of eye and adnexa: Secondary | ICD-10-CM | POA: Diagnosis not present

## 2016-03-22 ENCOUNTER — Ambulatory Visit (INDEPENDENT_AMBULATORY_CARE_PROVIDER_SITE_OTHER): Payer: Worker's Compensation

## 2016-03-22 ENCOUNTER — Ambulatory Visit (INDEPENDENT_AMBULATORY_CARE_PROVIDER_SITE_OTHER): Payer: Worker's Compensation | Admitting: Family Medicine

## 2016-03-22 ENCOUNTER — Encounter: Payer: Self-pay | Admitting: Family Medicine

## 2016-03-22 VITALS — BP 116/66 | HR 64 | Temp 98.2°F | Ht 70.0 in | Wt 234.6 lb

## 2016-03-22 DIAGNOSIS — H578 Other specified disorders of eye and adnexa: Secondary | ICD-10-CM | POA: Diagnosis not present

## 2016-03-22 DIAGNOSIS — M25571 Pain in right ankle and joints of right foot: Secondary | ICD-10-CM | POA: Diagnosis not present

## 2016-03-22 NOTE — Patient Instructions (Signed)
Great to see you!  We will work on a referral for you  We will call with xray results  Continue wearing the brace, using ice as needed.
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
## 2016-03-23 DIAGNOSIS — H578 Other specified disorders of eye and adnexa: Secondary | ICD-10-CM | POA: Diagnosis not present

## 2016-03-25 DIAGNOSIS — H578 Other specified disorders of eye and adnexa: Secondary | ICD-10-CM | POA: Diagnosis not present

## 2016-03-26 DIAGNOSIS — H578 Other specified disorders of eye and adnexa: Secondary | ICD-10-CM | POA: Diagnosis not present

## 2016-05-21 ENCOUNTER — Other Ambulatory Visit: Payer: Self-pay | Admitting: Family Medicine

## 2016-07-05 ENCOUNTER — Encounter: Payer: Self-pay | Admitting: Family Medicine

## 2016-07-05 ENCOUNTER — Ambulatory Visit (INDEPENDENT_AMBULATORY_CARE_PROVIDER_SITE_OTHER): Payer: BC Managed Care – PPO | Admitting: Family Medicine

## 2016-07-05 VITALS — BP 110/80 | HR 67 | Temp 98.2°F | Ht 70.0 in | Wt 228.0 lb

## 2016-07-05 DIAGNOSIS — Z0000 Encounter for general adult medical examination without abnormal findings: Secondary | ICD-10-CM | POA: Diagnosis not present

## 2016-07-05 DIAGNOSIS — Z23 Encounter for immunization: Secondary | ICD-10-CM | POA: Diagnosis not present

## 2016-07-05 DIAGNOSIS — E785 Hyperlipidemia, unspecified: Secondary | ICD-10-CM | POA: Diagnosis not present

## 2016-07-05 DIAGNOSIS — R002 Palpitations: Secondary | ICD-10-CM | POA: Diagnosis not present

## 2016-07-05 LAB — LIPID PANEL
Cholesterol: 231 mg/dL — ABNORMAL HIGH (ref 0–200)
HDL: 37.7 mg/dL — ABNORMAL LOW (ref >39.00)
NonHDL: 193.56
Total CHOL/HDL Ratio: 6
Triglycerides: 252 mg/dL — ABNORMAL HIGH (ref 0.0–149.0)
VLDL: 50.4 mg/dL — ABNORMAL HIGH (ref 0.0–40.0)

## 2016-07-05 LAB — BASIC METABOLIC PANEL
BUN: 21 mg/dL (ref 6–23)
CO2: 26 mEq/L (ref 19–32)
Calcium: 10.7 mg/dL — ABNORMAL HIGH (ref 8.4–10.5)
Chloride: 103 mEq/L (ref 96–112)
Creatinine, Ser: 1.03 mg/dL (ref 0.40–1.50)
GFR: 80.6 mL/min (ref >60.00)
Glucose, Bld: 102 mg/dL — ABNORMAL HIGH (ref 70–99)
Potassium: 4.9 mEq/L (ref 3.5–5.1)
Sodium: 140 mEq/L (ref 135–145)

## 2016-07-05 LAB — CBC WITH DIFFERENTIAL/PLATELET
Basophils Absolute: 0 10*3/uL (ref 0.0–0.1)
Basophils Relative: 0.4 % (ref 0.0–3.0)
Eosinophils Absolute: 0.1 10*3/uL (ref 0.0–0.7)
Eosinophils Relative: 1.4 % (ref 0.0–5.0)
HCT: 45.9 % (ref 39.0–52.0)
Hemoglobin: 15.5 g/dL (ref 13.0–17.0)
Lymphocytes Relative: 49.2 % — ABNORMAL HIGH (ref 12.0–46.0)
Lymphs Abs: 3 10*3/uL (ref 0.7–4.0)
MCHC: 33.8 g/dL (ref 30.0–36.0)
MCV: 90.2 fl (ref 78.0–100.0)
Monocytes Absolute: 0.4 10*3/uL (ref 0.1–1.0)
Monocytes Relative: 6.8 % (ref 3.0–12.0)
Neutro Abs: 2.5 10*3/uL (ref 1.4–7.7)
Neutrophils Relative %: 42.2 % — ABNORMAL LOW (ref 43.0–77.0)
Platelets: 267 10*3/uL (ref 150.0–400.0)
RBC: 5.09 Mil/uL (ref 4.22–5.81)
RDW: 13.1 % (ref 11.5–15.5)
WBC: 6 10*3/uL (ref 4.0–10.5)

## 2016-07-05 LAB — HEPATIC FUNCTION PANEL
ALT: 21 U/L (ref 0–53)
AST: 17 U/L (ref 0–37)
Albumin: 4.8 g/dL (ref 3.5–5.2)
Alkaline Phosphatase: 67 U/L (ref 39–117)
Bilirubin, Direct: 0.1 mg/dL (ref 0.0–0.3)
Total Bilirubin: 0.5 mg/dL (ref 0.2–1.2)
Total Protein: 7.3 g/dL (ref 6.0–8.3)

## 2016-07-05 LAB — TSH: TSH: 1.93 u[IU]/mL (ref 0.35–4.50)

## 2016-07-05 LAB — TESTOSTERONE: Testosterone: 368.06 ng/dL (ref 300.00–890.00)

## 2016-07-05 LAB — PSA: PSA: 0.67 ng/mL (ref 0.10–4.00)

## 2016-07-05 LAB — LDL CHOLESTEROL, DIRECT: Direct LDL: 149 mg/dL

## 2016-07-05 MED ORDER — DULOXETINE HCL 30 MG PO CPEP: 30.0000 mg | ORAL_CAPSULE | Freq: Every day | ORAL | 3 refills | Status: DC
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
## 2016-07-05 NOTE — Progress Notes (Signed)
Pre visit review using our clinic review tool, if applicable. No additional management support is needed unless otherwise documented below in the visit note. 

## 2016-07-05 NOTE — Patient Instructions (Signed)
Let me know if dizziness increases and especially if exercise related.

## 2016-09-06 ENCOUNTER — Other Ambulatory Visit: Payer: Self-pay | Admitting: Family Medicine

## 2016-11-15 ENCOUNTER — Other Ambulatory Visit: Payer: Self-pay | Admitting: Gastroenterology

## 2017-01-21 ENCOUNTER — Other Ambulatory Visit: Payer: Self-pay | Admitting: Family Medicine

## 2017-07-29 ENCOUNTER — Ambulatory Visit (INDEPENDENT_AMBULATORY_CARE_PROVIDER_SITE_OTHER): Payer: BLUE CROSS/BLUE SHIELD | Admitting: Family Medicine

## 2017-07-29 ENCOUNTER — Encounter: Payer: Self-pay | Admitting: Family Medicine

## 2017-07-29 VITALS — BP 108/70 | HR 75 | Temp 98.1°F | Ht 68.0 in | Wt 229.2 lb

## 2017-07-29 DIAGNOSIS — Z23 Encounter for immunization: Secondary | ICD-10-CM

## 2017-07-29 DIAGNOSIS — Z0000 Encounter for general adult medical examination without abnormal findings: Secondary | ICD-10-CM

## 2017-07-29 DIAGNOSIS — M25511 Pain in right shoulder: Secondary | ICD-10-CM | POA: Diagnosis not present

## 2017-07-29 DIAGNOSIS — Z125 Encounter for screening for malignant neoplasm of prostate: Secondary | ICD-10-CM | POA: Diagnosis not present

## 2017-07-29 LAB — LIPID PANEL
Cholesterol: 249 mg/dL — ABNORMAL HIGH (ref 0–200)
HDL: 36.3 mg/dL — ABNORMAL LOW (ref >39.00)
NonHDL: 212.41
Total CHOL/HDL Ratio: 7
Triglycerides: 364 mg/dL — ABNORMAL HIGH (ref 0.0–149.0)
VLDL: 72.8 mg/dL — ABNORMAL HIGH (ref 0.0–40.0)

## 2017-07-29 LAB — CBC WITH DIFFERENTIAL/PLATELET
Basophils Absolute: 0 10*3/uL (ref 0.0–0.1)
Basophils Relative: 0.7 % (ref 0.0–3.0)
Eosinophils Absolute: 0.1 10*3/uL (ref 0.0–0.7)
Eosinophils Relative: 1.7 % (ref 0.0–5.0)
HCT: 46.1 % (ref 39.0–52.0)
Hemoglobin: 15.5 g/dL (ref 13.0–17.0)
Lymphocytes Relative: 43.7 % (ref 12.0–46.0)
Lymphs Abs: 2.6 10*3/uL (ref 0.7–4.0)
MCHC: 33.5 g/dL (ref 30.0–36.0)
MCV: 93.1 fl (ref 78.0–100.0)
Monocytes Absolute: 0.5 10*3/uL (ref 0.1–1.0)
Monocytes Relative: 9.1 % (ref 3.0–12.0)
Neutro Abs: 2.7 10*3/uL (ref 1.4–7.7)
Neutrophils Relative %: 44.8 % (ref 43.0–77.0)
Platelets: 266 10*3/uL (ref 150.0–400.0)
RBC: 4.95 Mil/uL (ref 4.22–5.81)
RDW: 13.2 % (ref 11.5–15.5)
WBC: 6 10*3/uL (ref 4.0–10.5)

## 2017-07-29 LAB — BASIC METABOLIC PANEL
BUN: 19 mg/dL (ref 6–23)
CO2: 24 mEq/L (ref 19–32)
Calcium: 9.9 mg/dL (ref 8.4–10.5)
Chloride: 105 mEq/L (ref 96–112)
Creatinine, Ser: 1.06 mg/dL (ref 0.40–1.50)
GFR: 77.66 mL/min (ref >60.00)
Glucose, Bld: 121 mg/dL — ABNORMAL HIGH (ref 70–99)
Potassium: 4.6 mEq/L (ref 3.5–5.1)
Sodium: 139 mEq/L (ref 135–145)

## 2017-07-29 LAB — HEPATIC FUNCTION PANEL
ALT: 21 U/L (ref 0–53)
AST: 18 U/L (ref 0–37)
Albumin: 5 g/dL (ref 3.5–5.2)
Alkaline Phosphatase: 63 U/L (ref 39–117)
Bilirubin, Direct: 0.1 mg/dL (ref 0.0–0.3)
Total Bilirubin: 0.7 mg/dL (ref 0.2–1.2)
Total Protein: 7.3 g/dL (ref 6.0–8.3)

## 2017-07-29 LAB — PSA: PSA: 0.5 ng/mL (ref 0.10–4.00)

## 2017-07-29 LAB — LDL CHOLESTEROL, DIRECT: Direct LDL: 158 mg/dL

## 2017-07-29 LAB — TSH: TSH: 2.85 u[IU]/mL (ref 0.35–4.50)

## 2017-07-29 MED ORDER — PANTOPRAZOLE SODIUM 40 MG PO TBEC: 40.0000 mg | DELAYED_RELEASE_TABLET | Freq: Every day | ORAL | 3 refills | Status: DC

## 2017-07-29 NOTE — Progress Notes (Addendum)
Subjective:     Patient ID: Andrew Shields, male   DOB: 06-05-1966, 51 y.o.   MRN: [id number]  HPI Patient seen for physical exam.  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 He went out on disability related to right lower extremity injury. He is enjoying retirement. Stays active with his church. Needs flu vaccine today. No history of hepatitis C screening. Colonoscopy update. Tetanus up-to-date.  Has been exercising and has lost a few pounds since retirement.  He has had several weeks now of right shoulder pain and starting to awaken some night. Denies specific injury. No history of overuse activity. No neck pain.  Pain is moderate severity.   History of dyslipidemia on gemfibrozil for several years.  The 10-year ASCVD risk score Denman George(Goff DC Montez HagemanJr., et al., 2013) is: 6.2%   Values used to calculate the score:     Age: [age] years     Sex: Male     Is Non-Hispanic African American: No     Diabetic: No     Tobacco smoker: No     Systolic Blood Pressure: 108 mmHg     Is BP treated: No     HDL Cholesterol: 36.3 mg/dL     Total Cholesterol: 249 mg/dL   Past Medical History:  Diagnosis Date  . Hyperlipidemia    Past Surgical History:  Procedure Laterality Date  . CARPAL TUNNEL RELEASE Right   . COLONOSCOPY  04/19/2010   RMR: 1. normal rectum 2. normal colon. 3. Normal terminal ileum   . COLONOSCOPY N/A 06/06/2015   Dr. Elmer Rampourk:normal. Surveillance in 5 years   . ESOPHAGEAL DILATION N/A 06/06/2015   Procedure: ESOPHAGEAL DILATION;  Surgeon: Corbin Adeobert M Rourk, MD;  [facility];  Service: Endoscopy;  Laterality: N/A;  . ESOPHAGOGASTRODUODENOSCOPY N/A 06/06/2015   Dr. Rourk:mild erosive reflux/HH/s/p empiric dilation  . TONSILLECTOMY      reports that  has never smoked. he has never used smokeless tobacco. He reports that he does not drink alcohol or use drugs. family history includes Alcohol abuse in his father; Cancer in his father; Colon cancer in his mother; Diabetes in his  mother and sister; Heart disease (age of onset: 8060) in his mother; Hyperlipidemia in his brother. Allergies  Allergen Reactions  . Amoxicillin-Pot Clavulanate     REACTION: difficulty breathing     Review of Systems  Constitutional: Negative for activity change, appetite change, fatigue and fever.  HENT: Negative for congestion, ear pain and trouble swallowing.   Eyes: Negative for pain and visual disturbance.  Respiratory: Negative for cough, shortness of breath and wheezing.   Cardiovascular: Negative for chest pain and palpitations.  Gastrointestinal: Negative for abdominal distention, abdominal pain, blood in stool, constipation, diarrhea, nausea, rectal pain and vomiting.  Genitourinary: Negative for dysuria, hematuria and testicular pain.  Musculoskeletal: Positive for arthralgias (Right shoulder pain. See history of present illness). Negative for joint swelling.  Skin: Negative for rash.  Neurological: Negative for dizziness, syncope and headaches.  Hematological: Negative for adenopathy.  Psychiatric/Behavioral: Negative for confusion and dysphoric mood.       Objective:   Physical Exam  Constitutional: He is oriented to person, place, and time. He appears well-developed and well-nourished. No distress.  HENT:  Head: Normocephalic and atraumatic.  Right Ear: External ear normal.  Left Ear: External ear normal.  Mouth/Throat: Oropharynx is clear and moist.  Eyes: Conjunctivae and EOM are normal. Pupils are equal, round, and reactive to light.  Neck: Normal range of  motion. Neck supple. No thyromegaly present.  Cardiovascular: Normal rate, regular rhythm and normal heart sounds.  No murmur heard. Pulmonary/Chest: No respiratory distress. He has no wheezes. He has no rales.  Abdominal: Soft. Bowel sounds are normal. He exhibits no distension and no mass. There is no tenderness. There is no rebound and no guarding.  Musculoskeletal: He exhibits no edema.  Patient has pain  with abduction right shoulder against resistance and also with internal rotation  Lymphadenopathy:    He has no cervical adenopathy.  Neurological: He is alert and oriented to person, place, and time. He displays normal reflexes. No cranial nerve deficit.  Skin: No rash noted.  Psychiatric: He has a normal mood and affect.       Assessment:     Physical exam. Patient had some progressive right shoulder pains in recent weeks. Suspect rotator cuff tendinitis. Several issues addressed as below    Plan:     -Obtain screening lab work. Patient requests PSA after discussion of risks and benefits -Flu vaccine given -Discussed shingles vaccine and patient we placed on waiting list -Continue regular exercise habits and is encouraged to try to lose more weight -Set up sports medicine referral regarding right shoulder pain  Kristian CoveyBruce W Shaquasia Caponigro MD [facility]

## 2017-07-29 NOTE — Patient Instructions (Signed)
We have placed you on waiting list for new shingles vaccine.   We will set up sports medicine referral.

## 2017-07-30 ENCOUNTER — Telehealth: Payer: Self-pay | Admitting: Family Medicine

## 2017-07-30 ENCOUNTER — Ambulatory Visit: Payer: Self-pay | Admitting: Family Medicine

## 2017-07-30 LAB — HEPATITIS C ANTIBODY
Hepatitis C Ab: NONREACTIVE
SIGNAL TO CUT-OFF: 0.03 (ref <1.00)

## 2017-07-30 NOTE — Telephone Encounter (Signed)
Copied from CRM [id number]. Topic: Quick Communication - Office Called Patient >> Jul 30, 2017  3:48 PM Everardo PacificMoton, Kelly, [place] wrote: Reason for CRM: Patient missed a call from the office and would like a call back please

## 2017-07-30 NOTE — Telephone Encounter (Signed)
Patient is aware of lab results.

## 2017-08-14 ENCOUNTER — Ambulatory Visit: Payer: BLUE CROSS/BLUE SHIELD | Admitting: Family Medicine

## 2017-08-14 ENCOUNTER — Ambulatory Visit (INDEPENDENT_AMBULATORY_CARE_PROVIDER_SITE_OTHER)
Admission: RE | Admit: 2017-08-14 | Discharge: 2017-08-14 | Disposition: A | Discharge status: Home / Self Care | Payer: BLUE CROSS/BLUE SHIELD | Source: Ambulatory Visit | Attending: Family Medicine | Admitting: Family Medicine

## 2017-08-14 ENCOUNTER — Ambulatory Visit: Payer: Self-pay

## 2017-08-14 ENCOUNTER — Encounter: Payer: Self-pay | Admitting: Family Medicine

## 2017-08-14 VITALS — BP 104/74 | HR 74 | Ht 69.0 in | Wt 232.0 lb

## 2017-08-14 DIAGNOSIS — M5136 Other intervertebral disc degeneration, lumbar region: Secondary | ICD-10-CM | POA: Diagnosis not present

## 2017-08-14 DIAGNOSIS — M7551 Bursitis of right shoulder: Secondary | ICD-10-CM

## 2017-08-14 DIAGNOSIS — G8929 Other chronic pain: Secondary | ICD-10-CM

## 2017-08-14 DIAGNOSIS — M25511 Pain in right shoulder: Secondary | ICD-10-CM

## 2017-08-14 NOTE — Progress Notes (Signed)
[facility] [address] [place], [place] [address] Phone: [phone] Subjective:    I'm seeing this patient by the request  of:  [name], MD   CC: Right shoulder pain  BJY:NWGNFAOZHY  Andrew Shields is a 51 y.o. male coming in with complaint of right shoulder pain. States he gets occasional numbness and tingling in the finger tips. He says that right now his shoulder is burning anterior. He says the pain can move up into his neck and lateral shoulder.   Onset- Chronic Location- Anterior  Duration-seems to be more constant Character- Sharp radiating pain that stops in the elbow   Aggravating factors- Abduction Reliving factors-  Therapies tried-  Severity-6 out of 10.     Past Medical History:  Diagnosis Date  . Hyperlipidemia    Past Surgical History:  Procedure Laterality Date  . CARPAL TUNNEL RELEASE Right   . COLONOSCOPY  04/19/2010   RMR: 1. normal rectum 2. normal colon. 3. Normal terminal ileum   . COLONOSCOPY N/A 06/06/2015   Dr. Elmer Ramp. Surveillance in 5 years   . ESOPHAGEAL DILATION N/A 06/06/2015   Procedure: ESOPHAGEAL DILATION;  Surgeon: Corbin Ade, MD;  [facility];  Service: Endoscopy;  Laterality: N/A;  . ESOPHAGOGASTRODUODENOSCOPY N/A 06/06/2015   Dr. Rourk:mild erosive reflux/HH/s/p empiric dilation  . TONSILLECTOMY     Social History   Socioeconomic History  . Marital status: Married    Spouse name: Not on file  . Number of children: 0  . Years of education: Not on file  . Highest education level: Not on file  Social Needs  . Financial resource strain: Not on file  . Food insecurity - worry: Not on file  . Food insecurity - inability: Not on file  . Transportation needs - medical: Not on file  . Transportation needs - non-medical: Not on file  Occupational History  . Not on file  Tobacco Use  . Smoking status: Never Smoker  . Smokeless tobacco: Never Used  Substance and  Sexual Activity  . Alcohol use: No    Alcohol/week: 0.0 oz  . Drug use: No  . Sexual activity: Not on file  Other Topics Concern  . Not on file  Social History Narrative  . Not on file   Allergies  Allergen Reactions  . Amoxicillin-Pot Clavulanate     REACTION: difficulty breathing   Family History  Problem Relation Age of Onset  . Colon cancer Mother        age late 52s  . Heart disease Mother 55       stent  . Diabetes Mother        type ll  . Alcohol abuse Father   . Cancer Father        lung  . Hyperlipidemia Brother   . Diabetes Sister        ?type 2     Past medical history, social, surgical and family history all reviewed in electronic medical record.  No pertanent information unless stated regarding to the chief complaint.   Review of Systems:Review of systems updated and as accurate as of 08/14/17  No headache, visual changes, nausea, vomiting, diarrhea, constipation, dizziness, abdominal pain, skin rash, fevers, chills, night sweats, weight loss, swollen lymph nodes, body aches, joint swelling, muscle aches, chest pain, shortness of breath, mood changes.   Objective  There were no vitals taken for this visit. Systems examined below as of 08/14/17  General: No apparent distress alert and oriented x3 mood and affect normal, dressed appropriately.  HEENT: Pupils equal, extraocular movements intact  Respiratory: Patient's speak in full sentences and does not appear short of breath  Cardiovascular: No lower extremity edema, non tender, no erythema  Skin: Warm dry intact with no signs of infection or rash on extremities or on axial skeleton.  Abdomen: Soft nontender  Neuro: Cranial nerves II through XII are intact, neurovascularly intact in all extremities with 2+ DTRs and 2+ pulses.  Lymph: No lymphadenopathy of posterior or anterior cervical chain or axillae bilaterally.  Gait normal with good balance and coordination.  MSK:  Non tender with full range of  motion and good stability and symmetric strength and tone of elbows, wrist, hip, knee and ankles bilaterally.   Neck exam shows the patient does have some decreased range of motion lacking the last 10 degrees of extension and has only minimal side bending. Shoulder: Right Inspection reveals no abnormalities, atrophy or asymmetry. Palpation is normal with no tenderness over AC joint or bicipital groove. ROM is full in all planes passively. Rotator cuff strength normal throughout. signs of impingement with positive Neer and Hawkin's tests, but negative empty can sign. Speeds and Yergason's tests normal. No labral pathology noted with negative Obrien's, negative clunk and good stability. Normal scapular function observed. No painful arc and no drop arm sign. No apprehension sign  MSK US performed of: Right This study was ordered, performed, and interpreted by Terrilee FilesZach Cane Dubray D.O.  Shoulder:   Supraspinatus:  Appears normal on long and transverse views, Bursal bulge seen with shoulder abduction on impingement view. Infraspinatus:  Appears normal on long and transverse views. Significant increase in Doppler flow Subscapularis:  Appears normal on long and transverse views. Positive bursa Teres Minor:  Appears normal on long and transverse views. AC joint:  Capsule undistended, no geyser sign. Glenohumeral Joint:  Appears normal without effusion. Glenoid Labrum:  Intact without visualized tears. Biceps Tendon:  Appears normal on long and transverse views, no fraying of tendon, tendon located in intertubercular groove, no subluxation with shoulder internal or external rotation.  Impression: Subacromial bursitis  Procedure: Real-time Ultrasound Guided Injection of right glenohumeral joint Device: GE Logiq E  Ultrasound guided injection is preferred based studies that show increased duration, increased effect, greater accuracy, decreased procedural pain, increased response rate with ultrasound  guided versus blind injection.  Verbal informed consent obtained.  Time-out conducted.  Noted no overlying erythema, induration, or other signs of local infection.  Skin prepped in a sterile fashion.  Local anesthesia: Topical Ethyl chloride.  With sterile technique and under real time ultrasound guidance:  Joint visualized.  23g 1  inch needle inserted posterior approach. Pictures taken for needle placement. Patient did have injection of 2 cc of 1% lidocaine, 2 cc of 0.5% Marcaine, and 1.0 cc of Kenalog 40 mg/dL. Completed without difficulty  Pain immediately resolved suggesting accurate placement of the medication.  Advised to call if fevers/chills, erythema, induration, drainage, or persistent bleeding.  Images permanently stored and available for review in the ultrasound unit.  Impression: Technically successful ultrasound guided injection.  97110; 15 additional minutes spent for Therapeutic exercises as stated in above notes.  This included exercises focusing on stretching, strengthening, with significant focus on eccentric aspects.   Long term goals include an improvement in range of motion, strength, endurance as well as avoiding reinjury. Patient's frequency would include in 1-2 times a day, 3-5 times a week for a duration  of 6-12 weeks. Shoulder Exercises that included:  Basic scapular stabilization to include adduction and depression of scapula Scaption, focusing on proper movement and good control Internal and External rotation utilizing a theraband, with elbow tucked at side entire time Rows with theraband whic was given   Proper technique shown and discussed handout in great detail with ATC.  All questions were discussed and answered.     Impression and Recommendations:     This case required medical decision making of moderate complexity.      Note: This dictation was prepared with Dragon dictation along with smaller phrase technology. Any transcriptional errors that  result from this process are unintentional.

## 2017-08-14 NOTE — Patient Instructions (Signed)
Good to see you.  Ice 20 minutes 2 times daily. Usually after activity and before bed. Exercises 3 times a week.  Use the topical antiinflammatory 2 times a day  Keep hands within peripheral vision  Try the gabapentin only at night Xrays downstairs See me again in 3-4 weeks Happy holidays!

## 2017-08-14 NOTE — Assessment & Plan Note (Signed)
Patient given an injection today and tolerated the procedure well.  We discussed icing regimen, home exercise, topical anti-inflammatories encouraged.  X-rays of patient's neck and shoulder done today secondary to the possibility of cervical radiculopathy.  Gabapentin prescribed weeks and consider physical therapy

## 2017-08-28 ENCOUNTER — Ambulatory Visit (INDEPENDENT_AMBULATORY_CARE_PROVIDER_SITE_OTHER): Payer: BLUE CROSS/BLUE SHIELD

## 2017-08-28 DIAGNOSIS — Z23 Encounter for immunization: Secondary | ICD-10-CM | POA: Diagnosis not present

## 2017-08-28 DIAGNOSIS — Z0000 Encounter for general adult medical examination without abnormal findings: Secondary | ICD-10-CM

## 2017-08-28 NOTE — Progress Notes (Signed)
Patient got his first dose of shingrix vaccine this morning, pt tolerated the injection with no complications.

## 2017-09-10 NOTE — Progress Notes (Signed)
[name] D.O. [facility] [address] [place], [place] [address] Phone: [phone] Subjective:     CC: Right shoulder pain follow-up  BJY:NWGNFAOZHYHPI:Subjective  Andrew Shields is a 52 y.o. male coming in with complaint of right shoulder pain. Was found to have subacromial bursitis.  Partial tear of the rotator cuff.  Discussed icing regimen.  Given an injection.  2 weeks was improved but still had pain.  Seem to be on the anterior aspect of the shoulder still.  Still waking up at night     Past Medical History:  Diagnosis Date  . Hyperlipidemia    Past Surgical History:  Procedure Laterality Date  . CARPAL TUNNEL RELEASE Right   . COLONOSCOPY  04/19/2010   RMR: 1. normal rectum 2. normal colon. 3. Normal terminal ileum   . COLONOSCOPY N/A 06/06/2015   Dr. Elmer Rampourk:normal. Surveillance in 5 years   . ESOPHAGEAL DILATION N/A 06/06/2015   Procedure: ESOPHAGEAL DILATION;  Surgeon: Corbin Adeobert M Rourk, MD;  [facility];  Service: Endoscopy;  Laterality: N/A;  . ESOPHAGOGASTRODUODENOSCOPY N/A 06/06/2015   Dr. Rourk:mild erosive reflux/HH/s/p empiric dilation  . TONSILLECTOMY     Social History   Socioeconomic History  . Marital status: Married    Spouse name: None  . Number of children: 0  . Years of education: None  . Highest education level: None  Social Needs  . Financial resource strain: None  . Food insecurity - worry: None  . Food insecurity - inability: None  . Transportation needs - medical: None  . Transportation needs - non-medical: None  Occupational History  . None  Tobacco Use  . Smoking status: Never Smoker  . Smokeless tobacco: Never Used  Substance and Sexual Activity  . Alcohol use: No    Alcohol/week: 0.0 oz  . Drug use: No  . Sexual activity: None  Other Topics Concern  . None  Social History Narrative  . None   Allergies  Allergen Reactions  . Amoxicillin-Pot Clavulanate     REACTION: difficulty breathing   Family History    Problem Relation Age of Onset  . Colon cancer Mother        age late 6450s  . Heart disease Mother 9160       stent  . Diabetes Mother        type ll  . Alcohol abuse Father   . Cancer Father        lung  . Hyperlipidemia Brother   . Diabetes Sister        ?type 2     Past medical history, social, surgical and family history all reviewed in electronic medical record.  No pertanent information unless stated regarding to the chief complaint.   Review of Systems:Review of systems updated and as accurate as of 09/11/17  No headache, visual changes, nausea, vomiting, diarrhea, constipation, dizziness, abdominal pain, skin rash, fevers, chills, night sweats, weight loss, swollen lymph nodes, body aches, joint swelling, muscle aches, chest pain, shortness of breath, mood changes.   Objective  Blood pressure 122/82, pulse 62, height 5\' 9"  (1.753 m), weight 232 lb (105.2 kg), SpO2 95 %. Systems examined below as of 09/11/17   General: No apparent distress alert and oriented x3 mood and affect normal, dressed appropriately.  HEENT: Pupils equal, extraocular movements intact  Respiratory: Patient's speak in full sentences and does not appear short of breath  Cardiovascular: No lower extremity edema, non tender, no erythema  Skin: Warm  dry intact with no signs of infection or rash on extremities or on axial skeleton.  Abdomen: Soft nontender  Neuro: Cranial nerves II through XII are intact, neurovascularly intact in all extremities with 2+ DTRs and 2+ pulses.  Lymph: No lymphadenopathy of posterior or anterior cervical chain or axillae bilaterally.  Gait normal with good balance and coordination.  MSK:  Non tender with full range of motion and good stability and symmetric strength and tone of , elbows, wrist, hip, knee and ankles bilaterally.  Shoulder: Right Inspection reveals no abnormalities, atrophy or asymmetry. Palpation is normal with no tenderness over AC joint or bicipital  groove. ROM is full in all planes. Rotator cuff strength normal throughout. Mild to moderate impingement Speeds and Yergason's tests normal. Positive O'Brien's positive crossover Normal scapular function observed. No painful arc and no drop arm sign. No apprehension sign  Procedure: Real-time Ultrasound Guided Injection of right acromioclavicular joint Device: GE Logiq Q7 Ultrasound guided injection is preferred based studies that show increased duration, increased effect, greater accuracy, decreased procedural pain, increased response rate, and decreased cost with ultrasound guided versus blind injection.  Verbal informed consent obtained.  Time-out conducted.  Noted no overlying erythema, induration, or other signs of local infection.  Skin prepped in a sterile fashion.  Local anesthesia: Topical Ethyl chloride.  With sterile technique and under real time ultrasound guidance: With a 25-gauge 1 inch needle was injected with a total of 0.5 cc of 0.5% Marcaine and 0.5 cc of Kenalog 40 mg of mL Completed without difficulty  Pain immediately resolved suggesting accurate placement of the medication.  Advised to call if fevers/chills, erythema, induration, drainage, or persistent bleeding.  Images permanently stored and available for review in the ultrasound unit.  Impression: Technically successful ultrasound guided injection.     Impression and Recommendations:     This case required medical decision making of moderate complexity.      Note: This dictation was prepared with Dragon dictation along with smaller phrase technology. Any transcriptional errors that result from this process are unintentional.

## 2017-09-11 ENCOUNTER — Encounter: Payer: Self-pay | Admitting: Family Medicine

## 2017-09-11 ENCOUNTER — Ambulatory Visit: Payer: BLUE CROSS/BLUE SHIELD | Admitting: Family Medicine

## 2017-09-11 ENCOUNTER — Ambulatory Visit: Payer: Self-pay

## 2017-09-11 VITALS — BP 122/82 | HR 62 | Ht 69.0 in | Wt 232.0 lb

## 2017-09-11 DIAGNOSIS — G8929 Other chronic pain: Secondary | ICD-10-CM | POA: Diagnosis not present

## 2017-09-11 DIAGNOSIS — M19011 Primary osteoarthritis, right shoulder: Secondary | ICD-10-CM | POA: Insufficient documentation

## 2017-09-11 DIAGNOSIS — M25519 Pain in unspecified shoulder: Secondary | ICD-10-CM | POA: Diagnosis not present

## 2017-09-11 NOTE — Assessment & Plan Note (Signed)
Injected today a significant less pain immediately.  Discussed icing regimen.  Which activities of doing which wants to avoid.  Topical anti-inflammatories given.  Patient declined formal physical therapy.  Continues to have pain consider advanced imaging.

## 2017-09-11 NOTE — Patient Instructions (Signed)
Good to see you  Ice is your friend Continue the exercises Tart cherry extract any dose at night Colchicine 2 times daily for 3 days See me again in 4 weeks.

## 2017-10-08 NOTE — Progress Notes (Signed)
[name] [address] [place], [place] [address] Phone: [phone] Subjective:    I'm seeing this patient by the request  of:    CC: Right shoulder and neck pain follow-up  BJY:NWGNFAOZHY  Andrew Shields is a 52 y.o. male coming in with complaint of right shoulder and neck pain follow-up.  Patient was given injected in the shoulder December 20.  Did not acromioclavicular injection January 17.  Patient states that he is doing great. He does have some pain still with shoulder flexed such as holding onto steering wheel or using his mouse. He is using the tart cherry extract at night.  90% better.        Past Medical History:  Diagnosis Date  . Hyperlipidemia    Past Surgical History:  Procedure Laterality Date  . CARPAL TUNNEL RELEASE Right   . COLONOSCOPY  04/19/2010   RMR: 1. normal rectum 2. normal colon. 3. Normal terminal ileum   . COLONOSCOPY N/A 06/06/2015   Dr. Elmer Ramp. Surveillance in 5 years   . ESOPHAGEAL DILATION N/A 06/06/2015   Procedure: ESOPHAGEAL DILATION;  Surgeon: Corbin Ade, MD;  [facility];  Service: Endoscopy;  Laterality: N/A;  . ESOPHAGOGASTRODUODENOSCOPY N/A 06/06/2015   Dr. Rourk:mild erosive reflux/HH/s/p empiric dilation  . TONSILLECTOMY     Social History   Socioeconomic History  . Marital status: Married    Spouse name: None  . Number of children: 0  . Years of education: None  . Highest education level: None  Social Needs  . Financial resource strain: None  . Food insecurity - worry: None  . Food insecurity - inability: None  . Transportation needs - medical: None  . Transportation needs - non-medical: None  Occupational History  . None  Tobacco Use  . Smoking status: Never Smoker  . Smokeless tobacco: Never Used  Substance and Sexual Activity  . Alcohol use: No    Alcohol/week: 0.0 oz  . Drug use: No  . Sexual activity: None  Other Topics Concern  . None  Social History  Narrative  . None   Allergies  Allergen Reactions  . Amoxicillin-Pot Clavulanate     REACTION: difficulty breathing   Family History  Problem Relation Age of Onset  . Colon cancer Mother        age late 64s  . Heart disease Mother 54       stent  . Diabetes Mother        type ll  . Alcohol abuse Father   . Cancer Father        lung  . Hyperlipidemia Brother   . Diabetes Sister        ?type 2     Past medical history, social, surgical and family history all reviewed in electronic medical record.  No pertanent information unless stated regarding to the chief complaint.   Review of Systems:Review of systems updated and as accurate as of 10/09/17  No headache, visual changes, nausea, vomiting, diarrhea, constipation, dizziness, abdominal pain, skin rash, fevers, chills, night sweats, weight loss, swollen lymph nodes, body aches, joint swelling,  chest pain, shortness of breath, mood changes.  Positive muscle aches  Objective  Blood pressure 114/82, pulse (!) 53, height 5' 9.5" (1.765 m), weight 227 lb (103 kg), SpO2 97 %. Systems examined below as of 10/09/17   General: No apparent distress alert and oriented x3 mood and affect normal, dressed appropriately.  HEENT: Pupils equal, extraocular  movements intact  Respiratory: Patient's speak in full sentences and does not appear short of breath  Cardiovascular: No lower extremity edema, non tender, no erythema  Skin: Warm dry intact with no signs of infection or rash on extremities or on axial skeleton.  Abdomen: Soft nontender  Neuro: Cranial nerves II through XII are intact, neurovascularly intact in all extremities with 2+ DTRs and 2+ pulses.  Lymph: No lymphadenopathy of posterior or anterior cervical chain or axillae bilaterally.  Gait normal with good balance and coordination.  MSK:  Non tender with full range of motion and good stability and symmetric strength and tone of elbows, wrist, hip, knee and ankles bilaterally.    Shoulder: Right Inspection reveals no abnormalities, atrophy or asymmetry. Palpation is acromioclavicular joint ROM is full in all planes. Rotator cuff strength normal throughout. No signs of impingement with negative Neer and Hawkin's tests, empty can sign. Speeds and Yergason's tests normal. No labral pathology noted with negative Obrien's, negative clunk and good stability.  Very mild crossover discomfort. Normal scapular function observed. No painful arc and no drop arm sign. No apprehension sign        Impression and Recommendations:     This case required medical decision making of moderate complexity.      Note: This dictation was prepared with Dragon dictation along with smaller phrase technology. Any transcriptional errors that result from this process are unintentional.

## 2017-10-09 ENCOUNTER — Encounter: Payer: Self-pay | Admitting: Family Medicine

## 2017-10-09 ENCOUNTER — Ambulatory Visit: Payer: BLUE CROSS/BLUE SHIELD | Admitting: Family Medicine

## 2017-10-09 DIAGNOSIS — M19011 Primary osteoarthritis, right shoulder: Secondary | ICD-10-CM

## 2017-10-09 NOTE — Patient Instructions (Signed)
You are doing great! Keep it up  Try to keep hands within your peripheral vision  See me when you need me [phone]

## 2017-10-09 NOTE — Assessment & Plan Note (Signed)
Much improved after the injection.  Patient will continue with conservative therapy and follow-up with me as needed

## 2017-10-27 ENCOUNTER — Ambulatory Visit (INDEPENDENT_AMBULATORY_CARE_PROVIDER_SITE_OTHER): Payer: BLUE CROSS/BLUE SHIELD | Admitting: Family Medicine

## 2017-10-27 DIAGNOSIS — Z23 Encounter for immunization: Secondary | ICD-10-CM

## 2018-05-27 ENCOUNTER — Other Ambulatory Visit: Payer: Self-pay | Admitting: Family Medicine

## 2018-05-29 ENCOUNTER — Other Ambulatory Visit: Payer: Self-pay

## 2018-05-29 ENCOUNTER — Encounter: Payer: Self-pay | Admitting: Family Medicine

## 2018-05-29 ENCOUNTER — Ambulatory Visit: Payer: BLUE CROSS/BLUE SHIELD | Admitting: Family Medicine

## 2018-05-29 VITALS — BP 112/76 | HR 57 | Temp 98.3°F | Ht 69.0 in | Wt 228.1 lb

## 2018-05-29 DIAGNOSIS — Z23 Encounter for immunization: Secondary | ICD-10-CM | POA: Diagnosis not present

## 2018-05-29 DIAGNOSIS — M25561 Pain in right knee: Secondary | ICD-10-CM

## 2018-05-29 DIAGNOSIS — E785 Hyperlipidemia, unspecified: Secondary | ICD-10-CM | POA: Diagnosis not present

## 2018-05-29 LAB — LIPID PANEL
Cholesterol: 234 mg/dL — ABNORMAL HIGH (ref 0–200)
HDL: 35 mg/dL — ABNORMAL LOW (ref >39.00)
NonHDL: 199.14
Total CHOL/HDL Ratio: 7
Triglycerides: 362 mg/dL — ABNORMAL HIGH (ref 0.0–149.0)
VLDL: 72.4 mg/dL — ABNORMAL HIGH (ref 0.0–40.0)

## 2018-05-29 LAB — HEPATIC FUNCTION PANEL
ALT: 22 U/L (ref 0–53)
AST: 21 U/L (ref 0–37)
Albumin: 4.7 g/dL (ref 3.5–5.2)
Alkaline Phosphatase: 61 U/L (ref 39–117)
Bilirubin, Direct: 0.1 mg/dL (ref 0.0–0.3)
Total Bilirubin: 0.6 mg/dL (ref 0.2–1.2)
Total Protein: 7.1 g/dL (ref 6.0–8.3)

## 2018-05-29 LAB — LDL CHOLESTEROL, DIRECT: Direct LDL: 148 mg/dL

## 2018-05-29 MED ORDER — MELOXICAM 15 MG PO TABS: 15.0000 mg | ORAL_TABLET | Freq: Every day | ORAL | 0 refills | Status: DC

## 2018-05-29 MED ORDER — GEMFIBROZIL 600 MG PO TABS: 600.0000 mg | ORAL_TABLET | Freq: Two times a day (BID) | ORAL | 3 refills | Status: DC
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
## 2018-05-29 NOTE — Patient Instructions (Signed)
Let me know if knee pain not improving in the next 3-4 weeks  May try some ice 15-20 minutes two times daily  Avoid lunges or repetitive knee bends.

## 2018-06-30 NOTE — Progress Notes (Signed)
[facility] [address] [place], [place] [address] Phone: [phone] Subjective:    I Andrew Shields am serving as a Neurosurgeon for Dr. Antoine Shields.   I'm seeing this patient by the request  of:    CC: Right knee pain  BJY:NWGNFAOZHY  Andrew Shields is a 52 y.o. male coming in with complaint of right knee pain. Was in therapy when he felt something in his knee. Says that it felt like a burn. Has damage in his right foot. Medial pain that radiates to the thigh. Feels like it is bone on bone. TTP. Pain wakes him up at night. Has been taking 15 mg of meloxicam that does not help. No numbness and tingling noted.   Onset- Chronic Location- Medial Duration-been constant for months now Character- Sharp Aggravating factors- standing, driving, weight bearing  Reliving factors-  Therapies tried- Ice, heat, meloxicam  Severity-8 out of 10.     Past Medical History:  Diagnosis Date  . Hyperlipidemia    Past Surgical History:  Procedure Laterality Date  . CARPAL TUNNEL RELEASE Right   . COLONOSCOPY  04/19/2010   RMR: 1. normal rectum 2. normal colon. 3. Normal terminal ileum   . COLONOSCOPY N/A 06/06/2015   Dr. Elmer Ramp. Surveillance in 5 years   . ESOPHAGEAL DILATION N/A 06/06/2015   Procedure: ESOPHAGEAL DILATION;  Surgeon: Corbin Ade, MD;  [facility];  Service: Endoscopy;  Laterality: N/A;  . ESOPHAGOGASTRODUODENOSCOPY N/A 06/06/2015   Dr. Rourk:mild erosive reflux/HH/s/p empiric dilation  . TONSILLECTOMY     Social History   Socioeconomic History  . Marital status: Married    Spouse name: Not on file  . Number of children: 0  . Years of education: Not on file  . Highest education level: Not on file  Occupational History  . Not on file  Social Needs  . Financial resource strain: Not on file  . Food insecurity:    Worry: Not on file    Inability: Not on file  . Transportation needs:    Medical: Not on file   Non-medical: Not on file  Tobacco Use  . Smoking status: Never Smoker  . Smokeless tobacco: Never Used  Substance and Sexual Activity  . Alcohol use: No    Alcohol/week: 0.0 standard drinks  . Drug use: No  . Sexual activity: Not on file  Lifestyle  . Physical activity:    Days per week: Not on file    Minutes per session: Not on file  . Stress: Not on file  Relationships  . Social connections:    Talks on phone: Not on file    Gets together: Not on file    Attends religious service: Not on file    Active member of club or organization: Not on file    Attends meetings of clubs or organizations: Not on file    Relationship status: Not on file  Other Topics Concern  . Not on file  Social History Narrative  . Not on file   Allergies  Allergen Reactions  . Amoxicillin-Pot Clavulanate     REACTION: difficulty breathing   Family History  Problem Relation Age of Onset  . Colon cancer Mother        age late 39s  . Heart disease Mother 67       stent  . Diabetes Mother        type ll  . Alcohol abuse Father   .  Cancer Father        lung  . Hyperlipidemia Brother   . Diabetes Sister        ?type 2     Current Outpatient Medications (Cardiovascular):  .  gemfibrozil (LOPID) 600 MG tablet, Take 1 tablet (600 mg total) by mouth 2 (two) times daily with a meal.   Current Outpatient Medications (Analgesics):  .  meloxicam (MOBIC) 15 MG tablet, Take 1 tablet (15 mg total) by mouth daily.   Current Outpatient Medications (Other):  .  fish oil-omega-3 fatty acids 1000 MG capsule, Take 2 g by mouth daily.   .  pantoprazole (PROTONIX) 40 MG tablet, Take 1 tablet (40 mg total) by mouth daily.    Past medical history, social, surgical and family history all reviewed in electronic medical record.  No pertanent information unless stated regarding to the chief complaint.   Review of Systems:  No headache, visual changes, nausea, vomiting, diarrhea, constipation, dizziness,  abdominal pain, skin rash, fevers, chills, night sweats, weight loss, swollen lymph nodes, body aches, joint swelling, muscle aches, chest pain, shortness of breath, mood changes.  Positive muscle aches  Objective  Blood pressure 120/82, pulse (!) 56, height 5\' 9"  (1.753 m), weight 235 lb (106.6 kg), SpO2 96 %. S   General: No apparent distress alert and oriented x3 mood and affect normal, dressed appropriately.  HEENT: Pupils equal, extraocular movements intact  Respiratory: Patient's speak in full sentences and does not appear short of breath  Cardiovascular: No lower extremity edema, non tender, no erythema  Skin: Warm dry intact with no signs of infection or rash on extremities or on axial skeleton.  Abdomen: Soft nontender  Neuro: Cranial nerves II through XII are intact, neurovascularly intact in all extremities with 2+ DTRs and 2+ pulses.  Lymph: No lymphadenopathy of posterior or anterior cervical chain or axillae bilaterally.  Gait antalgic walks with a right-sided foot drop MSK:  Non tender with full range of motion and good stability and symmetric strength and tone of shoulders, elbows, wrist, hip, knee and bilaterally.  Right ankle has significant amount of weakness especially with dorsiflexion. Knee: Right Normal to inspection with no erythema or effusion or obvious bony abnormalities. Palpation n tender over the medial joint line ROM full in flexion and extension and lower leg rotation. Ligaments with solid consistent endpoints including ACL, PCL, LCL, MCL. Positive Mcmurray's, Apley's, and Thessalonian tests. Mild painful patellar compression. Patellar glide without crepitus. Patellar and quadriceps tendons unremarkable. Hamstring and quadriceps strength is normal. Contralateral knee unremarkable  MSK US performed of: Right knee  this study was ordered, performed, and interpreted by Terrilee Files D.O.  Knee: All structures visualized. Anteromedial, meniscus appears to be  okay but patient's posterior medial meniscus does have an acute on chronic tear with mild displacement.  Hypoechoic changes surrounding the area.  MCL does appear intact.  Trace fluid in the patellofemoral pouch.  Mild narrowing of the patellofemoral joint.  Normal lateral joint line  IMPRESSION: Acute on chronic meniscal tear with mild osteoarthritic changes  After informed written and verbal consent, patient was seated on exam table. Right knee was prepped with alcohol swab and utilizing anterolateral approach, patient's right knee space was injected with 4:1  marcaine 0.5%: Kenalog 40mg /dL. Patient tolerated the procedure well without immediate complications.   Impression and Recommendations:     This case required medical decision making of moderate complexity. The above documentation has been reviewed and is accurate and complete Judi Saa,  DO       Note: This dictation was prepared with Dragon dictation along with smaller phrase technology. Any transcriptional errors that result from this process are unintentional.

## 2018-07-02 ENCOUNTER — Encounter: Payer: Self-pay | Admitting: Family Medicine

## 2018-07-02 ENCOUNTER — Ambulatory Visit: Payer: BLUE CROSS/BLUE SHIELD | Admitting: Family Medicine

## 2018-07-02 ENCOUNTER — Ambulatory Visit (INDEPENDENT_AMBULATORY_CARE_PROVIDER_SITE_OTHER)
Admission: RE | Admit: 2018-07-02 | Discharge: 2018-07-02 | Disposition: A | Discharge status: Home / Self Care | Payer: BLUE CROSS/BLUE SHIELD | Source: Ambulatory Visit | Attending: Family Medicine | Admitting: Family Medicine

## 2018-07-02 ENCOUNTER — Ambulatory Visit: Payer: Self-pay

## 2018-07-02 VITALS — BP 120/82 | HR 56 | Ht 69.0 in | Wt 235.0 lb

## 2018-07-02 DIAGNOSIS — S83241A Other tear of medial meniscus, current injury, right knee, initial encounter: Secondary | ICD-10-CM

## 2018-07-02 DIAGNOSIS — M1711 Unilateral primary osteoarthritis, right knee: Secondary | ICD-10-CM | POA: Diagnosis not present

## 2018-07-02 DIAGNOSIS — S92351A Displaced fracture of fifth metatarsal bone, right foot, initial encounter for closed fracture: Secondary | ICD-10-CM | POA: Diagnosis not present

## 2018-07-02 DIAGNOSIS — G8929 Other chronic pain: Secondary | ICD-10-CM

## 2018-07-02 DIAGNOSIS — M25561 Pain in right knee: Principal | ICD-10-CM

## 2018-07-02 DIAGNOSIS — M25571 Pain in right ankle and joints of right foot: Secondary | ICD-10-CM | POA: Diagnosis not present

## 2018-07-02 NOTE — Assessment & Plan Note (Signed)
Acute on chronic tear.  Injection given, icing regimen, home exercise given work with [employer].  Topical anti-inflammatories, discussed bracing.  Follow-up again in 4 weeks.  Worsening symptoms we will consider further options.

## 2018-07-02 NOTE — Patient Instructions (Signed)
Good to see you  You have a mensical tear.  Exercises 3 times a week.  Wear the brace daily 2 weeks Ice 20 minutes 2 times daily. Usually after activity and before bed. pennsaid pinkie amount topically 2 times daily as needed.  Heel lift in the end of your shoe to help bring toe up  Bring the MRI  See me again in 4-6 weeks

## 2018-07-02 NOTE — Assessment & Plan Note (Signed)
Patient undergone some type of the ankle possible surgery.  Patient does not remember.  We will get MRI.  Patient is walking with a foot drop and we need further evaluation potentially including EMG but will await patient's previous notes before further testing.

## 2018-08-02 NOTE — Progress Notes (Signed)
[name] [address] [place], [place] [address] Phone: [phone] Subjective:   Andrew Shields, am serving as a scribe for Dr. Antoine Primas.   CC: Right knee pain  BJY:NWGNFAOZHY  Andrew Shields is a 52 y.o. male coming in with complaint of right knee pain. He has had an increase in pain. May occasionally have pain at night that feels like a "pulse." Does wear the brace throughout the day which helps decrease his pain with deep knee bending. Standing for prolonged periods can still cause an increase in his pain.    Also referred pain.  Had a back injury.  Did have an MRI from an outside facility.  Patient was found to have moderate chronic tearing of the ligaments noted.  Moderate peroneal brevis.  Degenerative changes of the Lisfranc.  Also noted at base of fifth metatarsal fracture    Past Medical History:  Diagnosis Date  . Hyperlipidemia    Past Surgical History:  Procedure Laterality Date  . CARPAL TUNNEL RELEASE Right   . COLONOSCOPY  04/19/2010   RMR: 1. normal rectum 2. normal colon. 3. Normal terminal ileum   . COLONOSCOPY N/A 06/06/2015   Dr. Elmer Ramp. Surveillance in 5 years   . ESOPHAGEAL DILATION N/A 06/06/2015   Procedure: ESOPHAGEAL DILATION;  Surgeon: Corbin Ade, MD;  [facility];  Service: Endoscopy;  Laterality: N/A;  . ESOPHAGOGASTRODUODENOSCOPY N/A 06/06/2015   Dr. Rourk:mild erosive reflux/HH/s/p empiric dilation  . TONSILLECTOMY     Social History   Socioeconomic History  . Marital status: Married    Spouse name: Not on file  . Number of children: 0  . Years of education: Not on file  . Highest education level: Not on file  Occupational History  . Not on file  Social Needs  . Financial resource strain: Not on file  . Food insecurity:    Worry: Not on file    Inability: Not on file  . Transportation needs:    Medical: Not on file    Non-medical: Not on file  Tobacco Use  . Smoking  status: Never Smoker  . Smokeless tobacco: Never Used  Substance and Sexual Activity  . Alcohol use: No    Alcohol/week: 0.0 standard drinks  . Drug use: No  . Sexual activity: Not on file  Lifestyle  . Physical activity:    Days per week: Not on file    Minutes per session: Not on file  . Stress: Not on file  Relationships  . Social connections:    Talks on phone: Not on file    Gets together: Not on file    Attends religious service: Not on file    Active member of club or organization: Not on file    Attends meetings of clubs or organizations: Not on file    Relationship status: Not on file  Other Topics Concern  . Not on file  Social History Narrative  . Not on file   Allergies  Allergen Reactions  . Amoxicillin-Pot Clavulanate     REACTION: difficulty breathing   Family History  Problem Relation Age of Onset  . Colon cancer Mother        age late 71s  . Heart disease Mother 61       stent  . Diabetes Mother        type ll  . Alcohol abuse Father   . Cancer Father  lung  . Hyperlipidemia Brother   . Diabetes Sister        ?type 2     Current Outpatient Medications (Cardiovascular):  .  gemfibrozil (LOPID) 600 MG tablet, Take 1 tablet (600 mg total) by mouth 2 (two) times daily with a meal.   Current Outpatient Medications (Analgesics):  .  meloxicam (MOBIC) 15 MG tablet, Take 1 tablet (15 mg total) by mouth daily.   Current Outpatient Medications (Other):  .  fish oil-omega-3 fatty acids 1000 MG capsule, Take 2 g by mouth daily.   .  pantoprazole (PROTONIX) 40 MG tablet, Take 1 tablet (40 mg total) by mouth daily. .  Vitamin D, Ergocalciferol, (DRISDOL) 1.25 MG (50000 UT) CAPS capsule, Take 1 capsule (50,000 Units total) by mouth every 7 (seven) days.    Past medical history, social, surgical and family history all reviewed in electronic medical record.  No pertanent information unless stated regarding to the chief complaint.   Review of  Systems:  No headache, visual changes, nausea, vomiting, diarrhea, constipation, dizziness, abdominal pain, skin rash, fevers, chills, night sweats, weight loss, swollen lymph nodes, body aches, joint swelling, m chest pain, shortness of breath, mood changes.  Positive muscle aches  Objective  Blood pressure 102/70, pulse 73, height 5\' 9"  (1.753 m), weight 230 lb 12.8 oz (104.7 kg), SpO2 96 %.     General: No apparent distress alert and oriented x3 mood and affect normal, dressed appropriately.  HEENT: Pupils equal, extraocular movements intact  Respiratory: Patient's speak in full sentences and does not appear short of breath  Cardiovascular: No lower extremity edema, non tender, no erythema  Skin: Warm dry intact with no signs of infection or rash on extremities or on axial skeleton.  Abdomen: Soft nontender  Neuro: Cranial nerves II through XII are intact, neurovascularly intact in all extremities with 2+ DTRs and 2+ pulses.  Lymph: No lymphadenopathy of posterior or anterior cervical chain or axillae bilaterally.  Gait antalgic.  MSK:  Non tender with full range of motion and good stability and symmetric strength and tone of shoulders, elbows, wrist, hip,  bilaterally.   Knee: Right Normal to inspection with no erythema or effusion or obvious bony abnormalities. Tender over the medial joint line patient does have positive McMurray's ROM full in flexion and extension and lower leg rotation. Ligaments with solid consistent endpoints including ACL, PCL, LCL, MCL. Positive Mcmurray's, Apley's, and Thessalonian tests. Non painful patellar compression. Patellar glide without crepitus. Patellar and quadriceps tendons unremarkable. Hamstring and quadriceps strength is normal. Contralateral knee unremarkable   Ankle: Right Effusion noted Limited range of motion in all planes Strength is 5/5 in all directions. Stable lateral and medial ligaments; squeeze test and kleiger test  unremarkable; Talar dome does have tenderness noted No sign of peroneal tendon subluxations or tenderness to palpation Negative tarsal tunnel tinel's Able to walk 4 steps.  Mild antalgic limp.  Patient does have pain at the base of the fifth metatarsal.    Impression and Recommendations:     This case required medical decision making of moderate complexity. The above documentation has been reviewed and is accurate and complete Andrew SaaZachary M Smith, DO       Note: This dictation was prepared with Dragon dictation along with smaller phrase technology. Any transcriptional errors that result from this process are unintentional.

## 2018-08-03 ENCOUNTER — Other Ambulatory Visit: Payer: Self-pay | Admitting: Gastroenterology

## 2018-08-03 ENCOUNTER — Encounter: Payer: Self-pay | Admitting: Family Medicine

## 2018-08-03 ENCOUNTER — Ambulatory Visit: Payer: BLUE CROSS/BLUE SHIELD | Admitting: Family Medicine

## 2018-08-03 DIAGNOSIS — S83241A Other tear of medial meniscus, current injury, right knee, initial encounter: Secondary | ICD-10-CM

## 2018-08-03 DIAGNOSIS — S92354A Nondisplaced fracture of fifth metatarsal bone, right foot, initial encounter for closed fracture: Secondary | ICD-10-CM

## 2018-08-03 MED ORDER — VITAMIN D (ERGOCALCIFEROL) 1.25 MG (50000 UNIT) PO CAPS: 50000.0000 [IU] | ORAL_CAPSULE | ORAL | 0 refills | Status: DC

## 2018-08-03 NOTE — Assessment & Plan Note (Signed)
Fifth metatarsal fracture noted.  Discussed icing regimen and home exercise.  Once weekly vitamin D and K2 given.  Patient will follow-up with me 4 weeks. Patient has a cam walker.  Will use it at home more.

## 2018-08-03 NOTE — Patient Instructions (Addendum)
Good to see you  Ice is your friend Once weekly vitamin D for 12 weeks K2 over the counter daily for 4 weeks pennsaid twice daily as needed See me again in 4 weeks

## 2018-08-03 NOTE — Assessment & Plan Note (Signed)
Patient has made improvement.  Discussed icing regimen and home exercises.  Discussed which activities doing which was to avoid.  Discussed topical anti-inflammatories.  Patient given once weekly vitamin D for the foot.  Follow-up again in 4 weeks.

## 2018-08-04 ENCOUNTER — Other Ambulatory Visit: Payer: Self-pay | Admitting: Family Medicine

## 2018-08-06 ENCOUNTER — Other Ambulatory Visit: Payer: Self-pay

## 2018-08-06 ENCOUNTER — Telehealth: Payer: Self-pay | Admitting: Family Medicine

## 2018-08-06 MED ORDER — DICLOFENAC SODIUM 2 % TD SOLN: 2.0000 g | Freq: Two times a day (BID) | TRANSDERMAL | 3 refills | Status: DC

## 2018-08-06 NOTE — Telephone Encounter (Signed)
Copied from CRM [id number]. Topic: Quick Communication - See Telephone Encounter >> Aug 06, 2018  2:55 PM Arlyss Gandyichardson, Arash Karstens N, NT wrote: CRM for notification. See Telephone encounter for: 08/06/18. Pt [place] request for Pennsaid. Please advise.

## 2018-08-06 NOTE — Telephone Encounter (Signed)
Alphonzo SeveranceSam Blackwood let patient know that script was called in.

## 2018-08-06 NOTE — Telephone Encounter (Signed)
Tried to call patient but voicemail said that the party that I was calling could not accept calls at this time.

## 2018-09-02 NOTE — Progress Notes (Signed)
Andrew Shields D.O. [facility] [address] [place], [place] [address] Phone: [phone] Subjective:   Andrew Shields, Andrew Shields, am serving as a scribe for Dr. Antoine PrimasZachary Shields.   CC: Knee pain and foot pain follow-up  BJY:[id number]:Subjective  Andrew Shields is a 53 y.o. male coming in with complaint of right foot pain. Pain with weight bearing. Does not feel like he has improved since last visit. Patient has been using heel lift and ankle brace but states that this causes swelling   Has been wearing knee brace but continues to have medial knee pain. Does feel like he is seeing some improvement. Wakes up at night with pain. Has pain with knee in full extension. Throbbing pain has dimished. Is using K2 and Vitamin D.   Also continues to have right sided back pain.  Dull throbbing aching pain.       Past Medical History:  Diagnosis Date  . Hyperlipidemia    Past Surgical History:  Procedure Laterality Date  . CARPAL TUNNEL RELEASE Right   . COLONOSCOPY  04/19/2010   RMR: 1. normal rectum 2. normal colon. 3. Normal terminal ileum   . COLONOSCOPY N/A 06/06/2015   Dr. Elmer Rampourk:normal. Surveillance in 5 years   . ESOPHAGEAL DILATION N/A 06/06/2015   Procedure: ESOPHAGEAL DILATION;  Surgeon: Corbin Adeobert M Rourk, MD;  Location: [facility];  Service: Endoscopy;  Laterality: N/A;  . ESOPHAGOGASTRODUODENOSCOPY N/A 06/06/2015   Dr. Rourk:mild erosive reflux/HH/s/p empiric dilation  . TONSILLECTOMY     Social History   Socioeconomic History  . Marital status: Married    Spouse name: Not on file  . Number of children: 0  . Years of education: Not on file  . Highest education level: Not on file  Occupational History  . Not on file  Social Needs  . Financial resource strain: Not on file  . Food insecurity:    Worry: Not on file    Inability: Not on file  . Transportation needs:    Medical: Not on file    Non-medical: Not on file  Tobacco Use  . Smoking status: Never Smoker  . Smokeless  tobacco: Never Used  Substance and Sexual Activity  . Alcohol use: No    Alcohol/week: 0.0 standard drinks  . Drug use: No  . Sexual activity: Not on file  Lifestyle  . Physical activity:    Days per week: Not on file    Minutes per session: Not on file  . Stress: Not on file  Relationships  . Social connections:    Talks on phone: Not on file    Gets together: Not on file    Attends religious service: Not on file    Active member of club or organization: Not on file    Attends meetings of clubs or organizations: Not on file    Relationship status: Not on file  Other Topics Concern  . Not on file  Social History Narrative  . Not on file   Allergies  Allergen Reactions  . Amoxicillin-Pot Clavulanate     REACTION: difficulty breathing   Family History  Problem Relation Age of Onset  . Colon cancer Mother        age late 1750s  . Heart disease Mother [id number]       stent  . Diabetes Mother        type ll  . Alcohol abuse Father   . Cancer Father        lung  .  Hyperlipidemia Brother   . Diabetes Sister        ?type 2     Current Outpatient Medications (Cardiovascular):  .  gemfibrozil (LOPID) 600 MG tablet, Take 1 tablet (600 mg total) by mouth 2 (two) times daily with a meal.     Current Outpatient Medications (Other):  Marland Kitchen.  Diclofenac Sodium 2 % SOLN, Place 2 g onto the skin 2 (two) times daily. .  fish oil-omega-3 fatty acids 1000 MG capsule, Take 2 g by mouth daily.   .  Menaquinone-7 (VITAMIN K2 PO), Take by mouth. .  pantoprazole (PROTONIX) 40 MG tablet, TAKE 1 TABLET ONCE DAILY.    Past medical history, social, surgical and family history all reviewed in electronic medical record.  No pertanent information unless stated regarding to the chief complaint.   Review of Systems:  No headache, visual changes, nausea, vomiting, diarrhea, constipation, dizziness, abdominal pain, skin rash, fevers, chills, night sweats, weight loss, swollen lymph nodes, body aches,  joint swelling, muscle aches, chest pain, shortness of breath, mood changes.   Objective  Blood pressure 112/70, pulse 75, height 5\' 9"  (1.753 m), weight 236 lb (107 kg), SpO2 98 %.     General: No apparent distress alert and oriented x3 mood and affect normal, dressed appropriately.  HEENT: Pupils equal, extraocular movements intact  Respiratory: Patient's speak in full sentences and does not appear short of breath  Cardiovascular: No lower extremity edema, non tender, no erythema  Skin: Warm dry intact with no signs of infection or rash on extremities or on axial skeleton.  Abdomen: Soft nontender  Neuro: Cranial nerves II through XII are intact, neurovascularly intact in all extremities with 2+ DTRs and 2+ pulses.  Lymph: No lymphadenopathy of posterior or anterior cervical chain or axillae bilaterally.  Gait antalgic MSK:  Non tender with full range of motion and good stability and symmetric strength and tone of shoulders, elbows, wrist, hip, and bilaterally.  Knee:right  Normal to inspection with no erythema or effusion or obvious bony abnormalities. Palpation normal with no warmth, joint line tenderness, patellar tenderness, or condyle tenderness. ROM full in flexion and extension and lower leg rotation. Ligaments with solid consistent endpoints including ACL, PCL, LCL, MCL. Negative Mcmurray's, Apley's, and Thessalonian tests. Non painful patellar compression. Patellar glide without crepitus. Patellar and quadriceps tendons unremarkable. Hamstring and quadriceps strength is normal.  Foot exam Shows patient is systolic with a foot drop noted and some weakness noted.  Patient does have though when attempting to do dorsiflexion goes into spasm with the extensor tendons from 2 through 4.  Patient does have abnormality with significant cavus foot medially and inverted laterally.  Minimal discomfort over the proximal fifth metatarsal and mild over the fourth.  Back exam shows a mild  loss of lordosis.  Significant tightness of the hamstring.  Tender to palpation in the paraspinal musculature lumbar spine.  Unable to do Pearlean BrownieFaber secondary to body habitus and tightness.  Neurovascularly intact somewhat but once again difficulty with testing with the ankle and foot  Limited musculoskeletal ultrasound was performed and interpreted by Judi SaaZachary M Shields  Limited ultrasound shows the patient does have subacute fractures of the proximal fifth and fourth metatarsals noted.  These are in varying degrees of healing.  Patient does have arthritic changes of the midfoot noted.  Mild increase in vascularity in the area but no true masses appreciated.   Impression and Recommendations:     This case required medical decision making of moderate  complexity. The above documentation has been reviewed and is accurate and complete Lyndal Pulley, DO       Note: This dictation was prepared with Dragon dictation along with smaller phrase technology. Any transcriptional errors that result from this process are unintentional.

## 2018-09-03 ENCOUNTER — Encounter: Payer: Self-pay | Admitting: Family Medicine

## 2018-09-03 ENCOUNTER — Ambulatory Visit: Payer: BLUE CROSS/BLUE SHIELD | Admitting: Family Medicine

## 2018-09-03 ENCOUNTER — Ambulatory Visit (INDEPENDENT_AMBULATORY_CARE_PROVIDER_SITE_OTHER)
Admission: RE | Admit: 2018-09-03 | Discharge: 2018-09-03 | Disposition: A | Discharge status: Home / Self Care | Payer: BLUE CROSS/BLUE SHIELD | Source: Ambulatory Visit | Attending: Family Medicine | Admitting: Family Medicine

## 2018-09-03 ENCOUNTER — Encounter: Payer: Self-pay | Admitting: Neurology

## 2018-09-03 ENCOUNTER — Ambulatory Visit: Payer: Self-pay

## 2018-09-03 VITALS — BP 112/70 | HR 75 | Ht 69.0 in | Wt 236.0 lb

## 2018-09-03 DIAGNOSIS — M545 Low back pain, unspecified: Secondary | ICD-10-CM

## 2018-09-03 DIAGNOSIS — M79671 Pain in right foot: Secondary | ICD-10-CM

## 2018-09-03 DIAGNOSIS — M48061 Spinal stenosis, lumbar region without neurogenic claudication: Secondary | ICD-10-CM | POA: Diagnosis not present

## 2018-09-03 DIAGNOSIS — S83241A Other tear of medial meniscus, current injury, right knee, initial encounter: Secondary | ICD-10-CM | POA: Diagnosis not present

## 2018-09-03 DIAGNOSIS — M5416 Radiculopathy, lumbar region: Secondary | ICD-10-CM | POA: Diagnosis not present

## 2018-09-03 DIAGNOSIS — M21371 Foot drop, right foot: Secondary | ICD-10-CM

## 2018-09-03 DIAGNOSIS — S92354A Nondisplaced fracture of fifth metatarsal bone, right foot, initial encounter for closed fracture: Secondary | ICD-10-CM

## 2018-09-03 NOTE — Assessment & Plan Note (Addendum)
Possible nerve injury from the accident.  Also could be from the back.  EMG or nerve conduction study is studied as well.  Discussed icing regimen and home exercises.  We will monitor closely.  Fitted for custom brace will be happening and referred today.  Nerve conduction study ordered as well

## 2018-09-03 NOTE — Assessment & Plan Note (Signed)
There was some mild healing but but does have some post arthritic changes is likely contributing to some of the pain as well.  Discussed with patient again at great length about icing regimen, home exercise, topical anti-inflammatories.  Patient though also has foot drop.  We will get a nerve conduction test to further evaluate why the foot drop is there but in addition to start getting patient to be fitted for a custom AFO brace.  Follow-up with me again 4 weeks

## 2018-09-03 NOTE — Assessment & Plan Note (Signed)
Doing much better at this time.  Not having as much pain.  Feels like he is making significant improvement.  No change in management

## 2018-09-03 NOTE — Patient Instructions (Signed)
Good to see you  Ice is your friend Stay active We will get xray today  Will also get nerve conduction test to see what is goin on  [facility] will call you about braces  See me agai nin 4 weeks but we will be talking

## 2018-09-15 ENCOUNTER — Ambulatory Visit (INDEPENDENT_AMBULATORY_CARE_PROVIDER_SITE_OTHER): Payer: BLUE CROSS/BLUE SHIELD | Admitting: Neurology

## 2018-09-15 DIAGNOSIS — R200 Anesthesia of skin: Secondary | ICD-10-CM | POA: Diagnosis not present

## 2018-09-16 ENCOUNTER — Other Ambulatory Visit: Payer: Self-pay | Admitting: *Deleted

## 2018-09-16 DIAGNOSIS — R200 Anesthesia of skin: Secondary | ICD-10-CM

## 2018-09-16 NOTE — Procedures (Signed)
[facility]  [address] [place], [address]  [place], [place] [address] Tel: [phone] Fax:  [phone] Test Date:  09/15/2018  Patient: Andrew Shields DOB: 25-Feb-1966 Physician: Nita Sickle, DO  Sex: Male Height: 5\' 9"  Ref Phys: Antoine Primas, DO  ID#: [id number] Temp: 34.0C Technician:    Patient Complaints: This is a 53 year-old man referred for evaluation of right foot weakness and pain.  NCV & EMG Findings: Extensive electrodiagnostic testing of the right lower extremity shows:  1. Right sural and superficial peroneal sensory responses are within normal limits. 2. Right peroneal and tibial motor responses are within normal limits. 3. Right tibial H reflex study is within normal limits. 4. There is no evidence of active or chronic motor axonal loss changes affecting any of the tested muscles.  There is a variable recruitment pattern seen in the flexor digitorum longus and medial gastrocnemius muscles, these findings are nonspecific and may be due to pain, musculoskeletal condition, or less likely central disorder of motor unit control.    Impression: This is a normal study of the right lower extremity.  There is no evidence of a sensorimotor polyneuropathy or lumbosacral radiculopathy affecting the right lower extremity.   ___________________________ Nita Sickle, DO    Nerve Conduction Studies Anti Sensory Summary Table   Stim Site NR Peak (ms) Norm Peak (ms) P-T Amp (V) Norm P-T Amp  Right Sup Peroneal Anti Sensory (Ant Lat Mall)  34C  12 cm    3.1 <4.6 8.9 >4  Right Sural Anti Sensory (Lat Mall)  34C  Calf    2.6 <4.6 7.7 >4   Motor Summary Table   Stim Site NR Onset (ms) Norm Onset (ms) O-P Amp (mV) Norm O-P Amp Site1 Site2 Delta-0 (ms) Dist (cm) Vel (m/s) Norm Vel (m/s)  Right Peroneal Motor (Ext Dig Brev)  34C  Ankle    3.5 <6.0 3.0 >2.5 B Fib Ankle 8.1 38.0 47 >40  B Fib    11.6  2.4  Poplt B Fib 2.2 10.0 45 >40  Poplt    13.8  2.0           Right Peroneal TA Motor (Tib Ant)  34C  Fib Head    3.4 <4.5 6.1 >3 Poplit Fib Head 1.4 9.0 64 >40  Poplit    4.8  6.0         Right Tibial Motor (Abd Hall Brev)  34C  Ankle    4.5 <6.0 14.1 >4 Knee Ankle 8.0 36.0 45 >40  Knee    12.5  9.6          H Reflex Studies   NR H-Lat (ms) Lat Norm (ms) L-R H-Lat (ms)  Right Tibial (Gastroc)  34C     34.29 <35    EMG   Side Muscle Ins Act Fibs Psw Fasc Number Recrt Dur Dur. Amp Amp. Poly Poly. Comment  Right AntTibialis Nml Nml Nml Nml Nml Nml Nml Nml Nml Nml Nml Nml N/A  Right Gastroc Nml Nml Nml Nml 1- Mod-V Nml Nml Nml Nml Nml Nml N/A  Right Flex Dig Long Nml Nml Nml Nml 1- Mod-V Nml Nml Nml Nml Nml Nml N/A  Right AbdHallucis Nml Nml Nml Nml Nml Nml Nml Nml Nml Nml Nml Nml N/A  Right RectFemoris Nml Nml Nml Nml Nml Nml Nml Nml Nml Nml Nml Nml N/A  Right GluteusMed Nml Nml Nml Nml Nml Nml Nml Nml Nml Nml Nml Nml N/A  Right BicepsFemS  Nml Nml Nml Nml Nml Nml Nml Nml Nml Nml Nml Nml N/A      Waveforms:

## 2018-09-21 DIAGNOSIS — M21371 Foot drop, right foot: Secondary | ICD-10-CM | POA: Diagnosis not present

## 2018-10-03 NOTE — Progress Notes (Signed)
[name] [address] [place], [place] [address] Phone: [phone] Subjective:     CC: Foot pain and leg pain follow-up  Andrew Shields:[id number]  Andrew Shields is a 53 y.o. male coming in with complaint of  Foot pain and leg pain.  Patient was having foot drop.  Put in an AFO.  Since then has been doing significantly better.  Feels that the foot is making improvement as well.  Noticing some increasing in strength.  Very happy with the results.  Feels like this is the most benefit he has made in quite some time.   Patient did undergo a nerve conduction study.  Nerve conduction study was unremarkable.  Past Medical History:  Diagnosis Date  . Hyperlipidemia    Past Surgical History:  Procedure Laterality Date  . CARPAL TUNNEL RELEASE Right   . COLONOSCOPY  04/19/2010   RMR: 1. normal rectum 2. normal colon. 3. Normal terminal ileum   . COLONOSCOPY N/A 06/06/2015   Dr. Elmer Ramp. Surveillance in 5 years   . ESOPHAGEAL DILATION N/A 06/06/2015   Procedure: ESOPHAGEAL DILATION;  Surgeon: Corbin Ade, MD;  [facility]: [facility];  Service: Endoscopy;  Laterality: N/A;  . ESOPHAGOGASTRODUODENOSCOPY N/A 06/06/2015   Dr. Rourk:mild erosive reflux/HH/s/p empiric dilation  . TONSILLECTOMY     Social History   Socioeconomic History  . Marital status: Married    Spouse name: Not on file  . Number of children: 0  . Years of education: Not on file  . Highest education level: Not on file  Occupational History  . Not on file  Social Needs  . Financial resource strain: Not on file  . Food insecurity:    Worry: Not on file    Inability: Not on file  . Transportation needs:    Medical: Not on file    Non-medical: Not on file  Tobacco Use  . Smoking status: Never Smoker  . Smokeless tobacco: Never Used  Substance and Sexual Activity  . Alcohol use: No    Alcohol/week: 0.0 standard drinks  . Drug use: No  . Sexual activity: Not on file  Lifestyle    . Physical activity:    Days per week: Not on file    Minutes per session: Not on file  . Stress: Not on file  Relationships  . Social connections:    Talks on phone: Not on file    Gets together: Not on file    Attends religious service: Not on file    Active member of club or organization: Not on file    Attends meetings of clubs or organizations: Not on file    Relationship status: Not on file  Other Topics Concern  . Not on file  Social History Narrative  . Not on file   Allergies  Allergen Reactions  . Amoxicillin-Pot Clavulanate     REACTION: difficulty breathing   Family History  Problem Relation Age of Onset  . Colon cancer Mother        age late 60s  . Heart disease Mother 49       stent  . Diabetes Mother        type ll  . Alcohol abuse Father   . Cancer Father        lung  . Hyperlipidemia Brother   . Diabetes Sister        ?type 2     Current Outpatient Medications (Cardiovascular):  .  gemfibrozil (LOPID)  600 MG tablet, Take 1 tablet (600 mg total) by mouth 2 (two) times daily with a meal.     Current Outpatient Medications (Other):  Marland Kitchen  Diclofenac Sodium 2 % SOLN, Place 2 g onto the skin 2 (two) times daily. .  fish oil-omega-3 fatty acids 1000 MG capsule, Take 2 g by mouth daily.   .  Menaquinone-7 (VITAMIN K2 PO), Take by mouth. .  pantoprazole (PROTONIX) 40 MG tablet, TAKE 1 TABLET ONCE DAILY.    Past medical history, social, surgical and family history all reviewed in electronic medical record.  No pertanent information unless stated regarding to the chief complaint.   Review of Systems:  No headache, visual changes, nausea, vomiting, diarrhea, constipation, dizziness, abdominal pain, skin rash, fevers, chills, night sweats, weight loss, swollen lymph nodes,s, chest pain, shortness of breath, mood changes.  Positive muscle aches, body aches  Objective  Blood pressure 118/82, pulse 79, height 5\' 9"  (1.753 m), weight 235 lb (106.6 kg), SpO2  98 %.    General: No apparent distress alert and oriented x3 mood and affect normal, dressed appropriately.  HEENT: Pupils equal, extraocular movements intact  Respiratory: Patient's speak in full sentences and does not appear short of breath  Cardiovascular: No lower extremity edema, non tender, no erythema  Skin: Warm dry intact with no signs of infection or rash on extremities or on axial skeleton.  Abdomen: Soft nontender  Neuro: Cranial nerves II through XII are intact, neurovascularly intact in all extremities with 2+ DTRs and 2+ pulses.  Lymph: No lymphadenopathy of posterior or anterior cervical chain or axillae bilaterally.  Gait mild antalgic but significant improvement with the AFO. MSK:  Non tender with full range of motion and good stability and symmetric strength and tone of shoulders, elbows, wrist, hip, knees.  Mild arthritic changes of multiple joints.  \Patient's ankle exam shows the patient has had improvement in range of motion.  Still some mild weakness of dorsiflexion against resistance compared to the contralateral side.  Patient does have the abnormality of the foot noted in the mild supinated state.  Patient does have the abnormality over the base of the fourth and fifth metatarsal but swelling has improved as well.  Minimally tender in this area.  Neurovascular intact distally.  Limited musculoskeletal ultrasound was performed and interpreted by Judi Saa  Limited ultrasound shows the patient is having more of a callus formation over the fifth metatarsal.  Posttraumatic arthritic changes noted though.  Fourth metatarsal also has some posttraumatic changes but does have callus formation which is new from previous exam.    Impression and Recommendations:     This case required medical decision making of moderate complexity. The above documentation has been reviewed and is accurate and complete Judi Saa, DO       Note: This dictation was prepared with  Dragon dictation along with smaller phrase technology. Any transcriptional errors that result from this process are unintentional.

## 2018-10-05 ENCOUNTER — Ambulatory Visit: Payer: BLUE CROSS/BLUE SHIELD | Admitting: Family Medicine

## 2018-10-05 ENCOUNTER — Encounter: Payer: Self-pay | Admitting: Family Medicine

## 2018-10-05 DIAGNOSIS — M21371 Foot drop, right foot: Secondary | ICD-10-CM

## 2018-10-05 DIAGNOSIS — S92354A Nondisplaced fracture of fifth metatarsal bone, right foot, initial encounter for closed fracture: Secondary | ICD-10-CM | POA: Diagnosis not present

## 2018-10-05 NOTE — Patient Instructions (Signed)
Good to see you  Ice 20 minutes 2 times daily. Usually after activity and before bed. I am proud of you  Kepe doing everything else Keep working on the range of motion  When walking out of the house continue to wear the brace  See me again in 8 weeks and if the foot still is not perfect I am consider injeciton in the scar tissue around the joint.

## 2018-10-05 NOTE — Assessment & Plan Note (Signed)
Some healing noted since previous exam.  Believe that the AFO is been very beneficial.  Patient has improved some of the strength as well.  EMG showed that patient does not have any long-term complications of the nerve and should actually do fairly well with the conservative therapy.  Patient will follow-up with me again 4 to 8 weeks for further evaluation and treatment.

## 2018-10-05 NOTE — Assessment & Plan Note (Signed)
Foot drop.  Patient though EMG shows that patient should do well.  Improvement in strength noted already.  Patient's gait significantly improved with the AFO.  Continue conservative therapy at this time and follow-up with me again in 2 months

## 2018-10-22 ENCOUNTER — Other Ambulatory Visit: Payer: Self-pay | Admitting: Family Medicine

## 2018-10-22 DIAGNOSIS — E785 Hyperlipidemia, unspecified: Secondary | ICD-10-CM

## 2018-12-04 NOTE — Progress Notes (Signed)
[name] [address] [place], [place] [address] Phone: [phone] Subjective:     CC: Right foot and ankle follow-up  EUM:[id number]  Andrew Shields is a 53 y.o. male coming in with complaint of right-sided foot pain with a nonunion to the fifth metatarsal as well as peroneal tendinitis mild foot drop.  Patient states wearing the AFO has been significantly helpful.  Patient though unfortunately continues in pain when putting his foot into the shoe.  States that otherwise seems to do well unless he stands for a long amount of time. Patient also complaining of right knee pain.  Has had a degenerative meniscal tear previously.  States that it was doing fairly well but now worsening again.  Last injection November.  States longer amount of walking.    Past Medical History:  Diagnosis Date  . Hyperlipidemia    Past Surgical History:  Procedure Laterality Date  . CARPAL TUNNEL RELEASE Right   . COLONOSCOPY  04/19/2010   RMR: 1. normal rectum 2. normal colon. 3. Normal terminal ileum   . COLONOSCOPY N/A 06/06/2015   Dr. Elmer Ramp. Surveillance in 5 years   . ESOPHAGEAL DILATION N/A 06/06/2015   Procedure: ESOPHAGEAL DILATION;  Surgeon: Corbin Ade, MD;  [facility];  Service: Endoscopy;  Laterality: N/A;  . ESOPHAGOGASTRODUODENOSCOPY N/A 06/06/2015   Dr. Rourk:mild erosive reflux/HH/s/p empiric dilation  . TONSILLECTOMY     Social History   Socioeconomic History  . Marital status: Married    Spouse name: Not on file  . Number of children: 0  . Years of education: Not on file  . Highest education level: Not on file  Occupational History  . Not on file  Social Needs  . Financial resource strain: Not on file  . Food insecurity:    Worry: Not on file    Inability: Not on file  . Transportation needs:    Medical: Not on file    Non-medical: Not on file  Tobacco Use  . Smoking status: Never Smoker  . Smokeless tobacco: Never  Used  Substance and Sexual Activity  . Alcohol use: No    Alcohol/week: 0.0 standard drinks  . Drug use: No  . Sexual activity: Not on file  Lifestyle  . Physical activity:    Days per week: Not on file    Minutes per session: Not on file  . Stress: Not on file  Relationships  . Social connections:    Talks on phone: Not on file    Gets together: Not on file    Attends religious service: Not on file    Active member of club or organization: Not on file    Attends meetings of clubs or organizations: Not on file    Relationship status: Not on file  Other Topics Concern  . Not on file  Social History Narrative  . Not on file   Allergies  Allergen Reactions  . Amoxicillin-Pot Clavulanate     REACTION: difficulty breathing   Family History  Problem Relation Age of Onset  . Colon cancer Mother        age late 16s  . Heart disease Mother 56       stent  . Diabetes Mother        type ll  . Alcohol abuse Father   . Cancer Father        lung  . Hyperlipidemia Brother   . Diabetes Sister        ?  type 2     Current Outpatient Medications (Cardiovascular):  .  gemfibrozil (LOPID) 600 MG tablet, TAKE 1 TABLET BY MOUTH 2 TIMES A DAY WITH MEALS.     Current Outpatient Medications (Other):  Marland Kitchen.  Diclofenac Sodium 2 % SOLN, Place 2 g onto the skin 2 (two) times daily. .  fish oil-omega-3 fatty acids 1000 MG capsule, Take 2 g by mouth daily.   .  Menaquinone-7 (VITAMIN K2 PO), Take by mouth. .  pantoprazole (PROTONIX) 40 MG tablet, TAKE 1 TABLET BY MOUTH ONCE DAILY. Marland Kitchen.  Vitamin D, Ergocalciferol, (DRISDOL) 1.25 MG (50000 UT) CAPS capsule, Take 1 capsule (50,000 Units total) by mouth every 7 (seven) days.    Past medical history, social, surgical and family history all reviewed in electronic medical record.  No pertanent information unless stated regarding to the chief complaint.   Review of Systems:  No headache, visual changes, nausea, vomiting, diarrhea, constipation,  dizziness, abdominal pain, skin rash, fevers, chills, night sweats, weight loss, swollen lymph nodes, body aches, , chest pain, shortness of breath, mood changes.  Positive muscle aches, joint swelling  Objective  Blood pressure 110/76, pulse 74, height 5\' 9"  (1.753 m), weight 237 lb (107.5 kg), SpO2 96 %.    General: No apparent distress alert and oriented x3 mood and affect normal, dressed appropriately.  HEENT: Pupils equal, extraocular movements intact  Respiratory: Patient's speak in full sentences and does not appear short of breath  Cardiovascular: No lower extremity edema, non tender, no erythema  Skin: Warm dry intact with no signs of infection or rash on extremities or on axial skeleton.  Abdomen: Soft nontender  Neuro: Cranial nerves II through XII are intact, neurovascularly intact in all extremities with 2+ DTRs and 2+ pulses.  Lymph: No lymphadenopathy of posterior or anterior cervical chain or axillae bilaterally.  Gait antalgic wearing AFO of right leg MSK:  Non tender with full range of motion and good stability and symmetric strength and tone of shoulders, elbows, wrist, hipand ankles bilaterally.  Knee: Right valgus deformity noted. Abnormal thigh to calf ratio.  Tender to palpation over medial and PF joint line.  ROM full in flexion and extension and lower leg rotation. instability with valgus force.  painful patellar compression. Patellar glide with moderate crepitus. Patellar and quadriceps tendons unremarkable. Hamstring and quadriceps strength is normal. Contralateral knee unremarkable   Right foot exam is held in more of a supinated stance.  Significant tightness noted.  Does have some weakness with resisted dorsiflexion of foot mild pain over the fourth and fifth metatarsals proximally  After informed written and verbal consent, patient was seated on exam table. Right knee was prepped with alcohol swab and utilizing anterolateral approach, patient's right knee  space was injected with 4:1  marcaine 0.5%: Kenalog 40mg /dL. Patient tolerated the procedure well without immediate complications.  Limited musculoskeletal ultrasound was performed and interpreted by Judi SaaZachary M Smith   Limited ultrasound of patient's right foot shows the patient still has a fourth metatarsal fracture that is approximately 50% healed at the moment.  Fifth metatarsal shows posttraumatic arthritis   Impression and Recommendations:     This case required medical decision making of moderate complexity. The above documentation has been reviewed and is accurate and complete Judi SaaZachary M Smith, DO       Note: This dictation was prepared with Dragon dictation along with smaller phrase technology. Any transcriptional errors that result from this process are unintentional.

## 2018-12-07 ENCOUNTER — Ambulatory Visit (INDEPENDENT_AMBULATORY_CARE_PROVIDER_SITE_OTHER): Payer: BLUE CROSS/BLUE SHIELD | Admitting: Family Medicine

## 2018-12-07 ENCOUNTER — Encounter: Payer: Self-pay | Admitting: Family Medicine

## 2018-12-07 ENCOUNTER — Other Ambulatory Visit: Payer: Self-pay

## 2018-12-07 ENCOUNTER — Ambulatory Visit: Payer: Self-pay

## 2018-12-07 VITALS — BP 110/76 | HR 74 | Ht 69.0 in | Wt 237.0 lb

## 2018-12-07 DIAGNOSIS — M21371 Foot drop, right foot: Secondary | ICD-10-CM

## 2018-12-07 DIAGNOSIS — M79671 Pain in right foot: Secondary | ICD-10-CM

## 2018-12-07 DIAGNOSIS — S92354A Nondisplaced fracture of fifth metatarsal bone, right foot, initial encounter for closed fracture: Secondary | ICD-10-CM

## 2018-12-07 DIAGNOSIS — S83241A Other tear of medial meniscus, current injury, right knee, initial encounter: Secondary | ICD-10-CM | POA: Diagnosis not present

## 2018-12-07 MED ORDER — VITAMIN D (ERGOCALCIFEROL) 1.25 MG (50000 UNIT) PO CAPS: 50000.0000 [IU] | ORAL_CAPSULE | ORAL | 0 refills | Status: DC

## 2018-12-07 NOTE — Assessment & Plan Note (Signed)
Patient will continue the AFO

## 2018-12-07 NOTE — Assessment & Plan Note (Signed)
Near healing.  Patient the fourth metatarsal still has some healing to do.  Restarted vitamin D supplementation.

## 2018-12-07 NOTE — Assessment & Plan Note (Signed)
Injection given today.  Discussed icing regimen and home exercise.  Discussed which activities to do which was to avoid.  Increase activity as tolerated.  Patient will follow-up again in 4 to 8 weeks

## 2018-12-07 NOTE — Patient Instructions (Signed)
Eat to see you  Refilled the vitamin D for another 12 weeks to be safe if you want K2 daily for 4 weeks may help as well  Consider taking the laces out daily on the shoe Injected the knee but should be good Stay safe See me again in 3 months!

## 2019-04-30 ENCOUNTER — Other Ambulatory Visit: Payer: Self-pay | Admitting: Family Medicine

## 2019-06-18 ENCOUNTER — Telehealth: Payer: Self-pay | Admitting: Family Medicine

## 2019-06-18 NOTE — Telephone Encounter (Addendum)
Patient has dropped off Handicapped Drivers Registration Plate form to be completed.  I have placed form in Dr. Thompson Caul box.  Please follow up with patient once completed.   Patient is requesting form to be mailed to him.

## 2019-06-20 NOTE — Telephone Encounter (Signed)
Are you okay with a handicapped form being completed for pt?

## 2019-06-21 NOTE — Telephone Encounter (Signed)
Yes- cannot walk more than 200 ft

## 2019-06-23 NOTE — Telephone Encounter (Signed)
Mailed to pt

## 2019-07-12 ENCOUNTER — Encounter: Payer: Self-pay | Admitting: Family Medicine

## 2019-07-12 ENCOUNTER — Ambulatory Visit (INDEPENDENT_AMBULATORY_CARE_PROVIDER_SITE_OTHER): Payer: BC Managed Care – PPO | Admitting: Family Medicine

## 2019-07-12 ENCOUNTER — Other Ambulatory Visit: Payer: Self-pay

## 2019-07-12 VITALS — BP 118/76 | HR 65 | Temp 97.5°F | Ht 66.6 in | Wt 231.1 lb

## 2019-07-12 DIAGNOSIS — Z125 Encounter for screening for malignant neoplasm of prostate: Secondary | ICD-10-CM

## 2019-07-12 DIAGNOSIS — E785 Hyperlipidemia, unspecified: Secondary | ICD-10-CM

## 2019-07-12 DIAGNOSIS — Z0000 Encounter for general adult medical examination without abnormal findings: Secondary | ICD-10-CM

## 2019-07-12 DIAGNOSIS — Z23 Encounter for immunization: Secondary | ICD-10-CM

## 2019-07-12 LAB — HEPATIC FUNCTION PANEL
ALT: 23 U/L (ref 0–53)
AST: 18 U/L (ref 0–37)
Albumin: 4.8 g/dL (ref 3.5–5.2)
Alkaline Phosphatase: 61 U/L (ref 39–117)
Bilirubin, Direct: 0.1 mg/dL (ref 0.0–0.3)
Total Bilirubin: 0.7 mg/dL (ref 0.2–1.2)
Total Protein: 7.3 g/dL (ref 6.0–8.3)

## 2019-07-12 LAB — CBC WITH DIFFERENTIAL/PLATELET
Basophils Absolute: 0 10*3/uL (ref 0.0–0.1)
Basophils Relative: 0.6 % (ref 0.0–3.0)
Eosinophils Absolute: 0.1 10*3/uL (ref 0.0–0.7)
Eosinophils Relative: 1.4 % (ref 0.0–5.0)
HCT: 45.9 % (ref 39.0–52.0)
Hemoglobin: 15.3 g/dL (ref 13.0–17.0)
Lymphocytes Relative: 41.1 % (ref 12.0–46.0)
Lymphs Abs: 2.9 10*3/uL (ref 0.7–4.0)
MCHC: 33.4 g/dL (ref 30.0–36.0)
MCV: 92.5 fl (ref 78.0–100.0)
Monocytes Absolute: 0.6 10*3/uL (ref 0.1–1.0)
Monocytes Relative: 8 % (ref 3.0–12.0)
Neutro Abs: 3.5 10*3/uL (ref 1.4–7.7)
Neutrophils Relative %: 48.9 % (ref 43.0–77.0)
Platelets: 243 10*3/uL (ref 150.0–400.0)
RBC: 4.96 Mil/uL (ref 4.22–5.81)
RDW: 13.6 % (ref 11.5–15.5)
WBC: 7.1 10*3/uL (ref 4.0–10.5)

## 2019-07-12 LAB — LIPID PANEL
Cholesterol: 234 mg/dL — ABNORMAL HIGH (ref 0–200)
HDL: 34.4 mg/dL — ABNORMAL LOW (ref >39.00)
Total CHOL/HDL Ratio: 7
Triglycerides: 540 mg/dL — ABNORMAL HIGH (ref 0.0–149.0)

## 2019-07-12 LAB — BASIC METABOLIC PANEL
BUN: 13 mg/dL (ref 6–23)
CO2: 26 mEq/L (ref 19–32)
Calcium: 9.8 mg/dL (ref 8.4–10.5)
Chloride: 102 mEq/L (ref 96–112)
Creatinine, Ser: 0.92 mg/dL (ref 0.40–1.50)
GFR: 85.41 mL/min (ref >60.00)
Glucose, Bld: 107 mg/dL — ABNORMAL HIGH (ref 70–99)
Potassium: 4.3 mEq/L (ref 3.5–5.1)
Sodium: 137 mEq/L (ref 135–145)

## 2019-07-12 LAB — PSA: PSA: 0.34 ng/mL (ref 0.10–4.00)

## 2019-07-12 LAB — LDL CHOLESTEROL, DIRECT: Direct LDL: 125 mg/dL

## 2019-07-12 LAB — TSH: TSH: 3.52 u[IU]/mL (ref 0.35–4.50)

## 2019-07-12 MED ORDER — GEMFIBROZIL 600 MG PO TABS: ORAL_TABLET | ORAL | 3 refills | Status: DC

## 2019-07-12 MED ORDER — PANTOPRAZOLE SODIUM 40 MG PO TBEC: 40.0000 mg | DELAYED_RELEASE_TABLET | Freq: Every day | ORAL | 3 refills | Status: DC

## 2019-07-12 NOTE — Progress Notes (Signed)
Subjective:     Patient ID: Andrew Shields, male   DOB: [birth year], 53 y.o.   MRN: [id number]  HPI Andrew Shields is seen for physical exam.  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  His wife still works full-time.  He needs flu vaccine  -Tetanus due next year -Shingles vaccine already given -Colonoscopy due next year -Previous hepatitis C screening negative  He takes gemfibrozil for hypertriglyceridemia and pantoprazole for GERD.  No medication side effects.  Good compliance.  Past Medical History:  Diagnosis Date  . Hyperlipidemia    Past Surgical History:  Procedure Laterality Date  . CARPAL TUNNEL RELEASE Right   . COLONOSCOPY  04/19/2010   RMR: 1. normal rectum 2. normal colon. 3. Normal terminal ileum   . COLONOSCOPY N/A 06/06/2015   Dr. Vivi Ferns. Surveillance in 5 years   . ESOPHAGEAL DILATION N/A 06/06/2015   Procedure: ESOPHAGEAL DILATION;  Surgeon: Daneil Dolin, MD;  [facility];  Service: Endoscopy;  Laterality: N/A;  . ESOPHAGOGASTRODUODENOSCOPY N/A 06/06/2015   Dr. Rourk:mild erosive reflux/HH/s/p empiric dilation  . TONSILLECTOMY      reports that he has never smoked. He has never used smokeless tobacco. He reports that he does not drink alcohol or use drugs. family history includes Alcohol abuse in his father; Cancer in his father; Colon cancer in his mother; Diabetes in his mother and sister; Heart disease (age of onset: 68) in his mother; Hyperlipidemia in his brother. Allergies  Allergen Reactions  . Amoxicillin-Pot Clavulanate     REACTION: difficulty breathing      Review of Systems  Constitutional: Negative for activity change, appetite change, fatigue, fever and unexpected weight change.  HENT: Negative for congestion, ear pain and trouble swallowing.   Eyes: Negative for pain and visual disturbance.  Respiratory:  Negative for cough, shortness of breath and wheezing.   Cardiovascular: Negative for chest pain and palpitations.  Gastrointestinal: Negative for abdominal distention, abdominal pain, blood in stool, constipation, diarrhea, nausea, rectal pain and vomiting.  Genitourinary: Negative for dysuria, hematuria and testicular pain.  Musculoskeletal: Negative for joint swelling.  Skin: Negative for rash.  Neurological: Negative for dizziness, syncope and headaches.  Hematological: Negative for adenopathy.  Psychiatric/Behavioral: Negative for confusion and dysphoric mood.       Objective:   Physical Exam Constitutional:      General: He is not in acute distress.    Appearance: He is well-developed.  HENT:     Head: Normocephalic and atraumatic.     Right Ear: External ear normal.     Left Ear: External ear normal.  Eyes:     Conjunctiva/sclera: Conjunctivae normal.     Pupils: Pupils are equal, round, and reactive to light.  Neck:     Musculoskeletal: Normal range of motion and neck supple.     Thyroid: No thyromegaly.  Cardiovascular:     Rate and Rhythm: Normal rate and regular rhythm.     Heart sounds: Normal heart sounds. No murmur.  Pulmonary:     Effort: No respiratory distress.     Breath sounds: No wheezing or rales.  Abdominal:     General: Bowel sounds are normal. There is no distension.     Palpations: Abdomen is soft. There is no mass.     Tenderness: There is no abdominal tenderness. There is no guarding or rebound.  Musculoskeletal:  Right lower leg: No edema.     Left lower leg: No edema.  Lymphadenopathy:     Cervical: No cervical adenopathy.  Skin:    Findings: No rash.  Neurological:     Mental Status: He is alert and oriented to person, place, and time.     Cranial Nerves: No cranial nerve deficit.     Deep Tendon Reflexes: Reflexes normal.        Assessment:     Physical exam.  We discussed the following health maintenance issues    Plan:      -Obtain screening labs including PSA -Flu vaccine given -Repeat tetanus and colonoscopy next year -Refilled his chronic medications for 1 year  [name] MD [facility]

## 2019-07-12 NOTE — Addendum Note (Signed)
Addended by: Sheffield Slider L on: 07/12/2019 11:43 AM   Modules accepted: Orders

## 2019-07-31 NOTE — Progress Notes (Signed)
REVIEWED-NO ADDITIONAL RECOMMENDATIONS. 

## 2020-01-13 ENCOUNTER — Encounter: Payer: Self-pay | Admitting: Family Medicine

## 2020-01-13 ENCOUNTER — Ambulatory Visit: Payer: BC Managed Care – PPO | Admitting: Family Medicine

## 2020-01-13 ENCOUNTER — Ambulatory Visit (INDEPENDENT_AMBULATORY_CARE_PROVIDER_SITE_OTHER): Payer: BC Managed Care – PPO

## 2020-01-13 ENCOUNTER — Other Ambulatory Visit: Payer: Self-pay

## 2020-01-13 ENCOUNTER — Ambulatory Visit (INDEPENDENT_AMBULATORY_CARE_PROVIDER_SITE_OTHER)
Admission: RE | Admit: 2020-01-13 | Discharge: 2020-01-13 | Disposition: A | Discharge status: Home / Self Care | Payer: BC Managed Care – PPO | Source: Ambulatory Visit | Attending: Family Medicine | Admitting: Family Medicine

## 2020-01-13 VITALS — BP 136/90 | HR 75 | Ht 66.6 in | Wt 230.0 lb

## 2020-01-13 DIAGNOSIS — M25562 Pain in left knee: Secondary | ICD-10-CM | POA: Diagnosis not present

## 2020-01-13 DIAGNOSIS — M1712 Unilateral primary osteoarthritis, left knee: Secondary | ICD-10-CM | POA: Diagnosis not present

## 2020-01-13 LAB — URIC ACID: Uric Acid, Serum: 5.3 mg/dL (ref 4.0–7.8)

## 2020-01-13 MED ORDER — PENNSAID 2 % EX SOLN
1.0000 | Freq: Two times a day (BID) | CUTANEOUS | 2 refills | Status: DC
Start: 2020-01-13 — End: 2020-07-26
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
## 2020-01-13 NOTE — Patient Instructions (Addendum)
Thank you for coming in today.  We will prescribe pennsaid  Get xray today.  Keep me updated if not better.  Ok to recheck with Dr Katrinka Blazing or myself.   Stop by the lab on your way out.  Pennsaid instructions: You have been given a sample/prescription for Pennsaid, a topical medication.     You are to apply this gel to your injured body part twice daily (morning and evening).   A little goes a long way so you can use about a pea-sized amount for each area.   Spread this small amount over the area into a thin film and let it dry.   Be sure that you do not rub the gel into your skin for more than 10 or 15 seconds otherwise it can irritate you skin.    Once you apply the gel, please do not put any other lotion or clothing in contact with that area for 30 minutes to allow the gel to absorb into your skin.   Some people are sensitive to the medication and can develop a sunburn-like rash.  If you have only mild symptoms it is okay to continue to use the medication but if you have any breakdown of your skin you should discontinue its use and please let us know.   If you have been written a prescription for Pennsaid, you will receive a pump bottle of this topical gel through a mail order pharmacy.  The instructions on the bottle will say to apply two pumps twice a day which may be too much gel for your particular area so use the pea-sized amount as your guide.  Instructions for Duexis, Pennsaid and Vimovo:  Your prescription will be filled through a participating [facility] mail order pharmacy.  You will receive a phone call or text from one of the participating pharmacies which can be located in any state in the [place].  You must communicate directly with them to have this medication filled.  When the pharmacy contacts you, they will need your mailing address (for shipment of the medication) andy they will need payment information if you have a copay (typically no more than $10). If  you have not heard from them 2-3 days after your appointment with Dr. Denyse Amass, contact [facility] directly at [phone].

## 2020-01-13 NOTE — Progress Notes (Signed)
Uric acid is normal.  This means the knee pain is very unlikely to be gout.

## 2020-01-13 NOTE — Progress Notes (Signed)
Left knee x-ray shows mild arthritis.

## 2020-02-22 ENCOUNTER — Emergency Department (HOSPITAL_COMMUNITY): Payer: BC Managed Care – PPO

## 2020-02-22 ENCOUNTER — Encounter (HOSPITAL_COMMUNITY): Payer: Self-pay | Admitting: Emergency Medicine

## 2020-02-22 ENCOUNTER — Other Ambulatory Visit: Payer: Self-pay

## 2020-02-22 ENCOUNTER — Emergency Department (HOSPITAL_COMMUNITY)
Admission: EM | Admit: 2020-02-22 | Discharge: 2020-02-22 | Disposition: A | Discharge status: Home / Self Care | Payer: BC Managed Care – PPO | Attending: Emergency Medicine | Admitting: Emergency Medicine

## 2020-02-22 DIAGNOSIS — M25561 Pain in right knee: Secondary | ICD-10-CM | POA: Diagnosis not present

## 2020-02-22 DIAGNOSIS — M791 Myalgia, unspecified site: Secondary | ICD-10-CM | POA: Insufficient documentation

## 2020-02-22 DIAGNOSIS — R200 Anesthesia of skin: Secondary | ICD-10-CM | POA: Insufficient documentation

## 2020-02-22 DIAGNOSIS — M79605 Pain in left leg: Secondary | ICD-10-CM | POA: Insufficient documentation

## 2020-02-22 DIAGNOSIS — R110 Nausea: Secondary | ICD-10-CM | POA: Diagnosis not present

## 2020-02-22 DIAGNOSIS — M25562 Pain in left knee: Secondary | ICD-10-CM | POA: Diagnosis not present

## 2020-02-22 DIAGNOSIS — M25462 Effusion, left knee: Secondary | ICD-10-CM | POA: Diagnosis not present

## 2020-02-22 LAB — BASIC METABOLIC PANEL
Anion gap: 13 (ref 5–15)
BUN: 17 mg/dL (ref 6–20)
CO2: 26 mmol/L (ref 22–32)
Calcium: 9.5 mg/dL (ref 8.9–10.3)
Chloride: 100 mmol/L (ref 98–111)
Creatinine, Ser: 0.79 mg/dL (ref 0.61–1.24)
GFR calc Af Amer: >60 mL/min (ref >60)
GFR calc non Af Amer: >60 mL/min (ref >60)
Glucose, Bld: 126 mg/dL — ABNORMAL HIGH (ref 70–99)
Potassium: 3.8 mmol/L (ref 3.5–5.1)
Sodium: 139 mmol/L (ref 135–145)

## 2020-02-22 MED ORDER — OXYCODONE-ACETAMINOPHEN 5-325 MG PO TABS: 2.0000 | ORAL_TABLET | Freq: Four times a day (QID) | ORAL | 0 refills | Status: DC | PRN

## 2020-02-22 MED ORDER — IBUPROFEN 600 MG PO TABS: 600.0000 mg | ORAL_TABLET | Freq: Four times a day (QID) | ORAL | 0 refills | Status: DC | PRN

## 2020-02-22 MED ORDER — OXYCODONE-ACETAMINOPHEN 5-325 MG PO TABS
1.0000 | ORAL_TABLET | Freq: Once | ORAL | Status: AC
  Administered 2020-02-22: 1 via ORAL
  Filled 2020-02-22: qty 1

## 2020-02-22 MED ORDER — IOHEXOL 350 MG/ML SOLN
100.0000 mL | Freq: Once | INTRAVENOUS | Status: AC | PRN
  Administered 2020-02-22: 100 mL via INTRAVENOUS

## 2020-02-22 MED ORDER — PREDNISONE 50 MG PO TABS
60.0000 mg | ORAL_TABLET | Freq: Once | ORAL | Status: AC
  Administered 2020-02-22: 60 mg via ORAL
  Filled 2020-02-22: qty 1

## 2020-02-22 MED ORDER — IBUPROFEN 800 MG PO TABS
800.0000 mg | ORAL_TABLET | Freq: Once | ORAL | Status: AC
  Administered 2020-02-22: 800 mg via ORAL
  Filled 2020-02-22: qty 1

## 2020-02-22 NOTE — Discharge Instructions (Addendum)
Please follow-up with your orthopedic doctor about your knee injury.  Advised that you wear the knee immobilizer and use crutches to get around the house until you see orthopedic doctor.  You can take this off when you're showering.

## 2020-02-22 NOTE — ED Triage Notes (Signed)
Pt reports chronic left knee pain that he see a sports medicine dr for, pt had a cortisone shot about a mo ago and over the past 3-4 days has had increased pain, left knee buckled last night and it has been locking up

## 2020-02-22 NOTE — ED Notes (Signed)
EDP speaking with pt now

## 2020-02-22 NOTE — ED Provider Notes (Signed)
[facility] EMERGENCY DEPARTMENT Provider Note   CSN: [id number] Arrival date & time: 02/22/20  8250     History Chief Complaint  Patient presents with  . Knee Pain    Andrew Shields is a 54 y.o. male with chronic knee pain presented to emergency department with pain in his left knee after a mechanical fall.  The patient reports that his knee has been buckling at home intermittently, and that yesterday he had an episode where the left knee again buckled. He feels like he fell and twisted his leg with a hyperflexion mechanism at the knee, did not strike the knee on the ground.  He reported significant spasms and pain mostly focal in the left lateral aspect of the knee but radiating up towards his mid thigh.  He is unable to bear weight on his left leg due to pain.  He tried ibuprofen last night with no relief.    He does report some numbness or paresthesias in his left foot which is new since incident yesterday.  He follows with Dr Antoine Primas for sports medicine and chronic knee pain, gets monthly steroid injections into the knee.  HPI     Past Medical History:  Diagnosis Date  . Hyperlipidemia     Patient Active Problem List   Diagnosis Date Noted  . Foot drop, right 09/03/2018  . Nondisplaced fracture of fifth metatarsal bone, right foot, initial encounter for closed fracture 08/03/2018  . Acute meniscal tear, medial, right, initial encounter 07/02/2018  . Arthrosis of right acromioclavicular joint 09/11/2017  . Acute shoulder bursitis, right 08/14/2017  . Erosive esophagitis 09/20/2015  . Prediabetes 07/05/2015  . Mucosal abnormality of stomach   . Dysphagia, pharyngoesophageal phase   . Family history of colon cancer   . FH: colon cancer in first degree relative <72 years old 05/10/2015  . Esophageal dysphagia 05/10/2015  . Ankle pain 12/10/2010  . EPIDIDYMITIS 02/27/2010  . HYPERLIPIDEMIA 01/12/2009  . CHEST PAIN, ATYPICAL 01/12/2009    Past Surgical History:   Procedure Laterality Date  . CARPAL TUNNEL RELEASE Right   . COLONOSCOPY  04/19/2010   RMR: 1. normal rectum 2. normal colon. 3. Normal terminal ileum   . COLONOSCOPY N/A 06/06/2015   Dr. Elmer Ramp. Surveillance in 5 years   . ESOPHAGEAL DILATION N/A 06/06/2015   Procedure: ESOPHAGEAL DILATION;  Surgeon: Corbin Ade, MD;  Location: [facility]ITE;  Service: Endoscopy;  Laterality: N/A;  . ESOPHAGOGASTRODUODENOSCOPY N/A 06/06/2015   Dr. Rourk:mild erosive reflux/HH/s/p empiric dilation  . TONSILLECTOMY         Family History  Problem Relation Age of Onset  . Colon cancer Mother        age late 51s  . Heart disease Mother 53       stent  . Diabetes Mother        type ll  . Alcohol abuse Father   . Cancer Father        lung  . Hyperlipidemia Brother   . Diabetes Sister        ?type 2    Social History   Tobacco Use  . Smoking status: Never Smoker  . Smokeless tobacco: Never Used  Vaping Use  . Vaping Use: Never used  Substance Use Topics  . Alcohol use: No    Alcohol/week: 0.0 standard drinks  . Drug use: No    Home Medications Prior to Admission medications   Medication Sig Start Date End Date Taking? Authorizing Provider  fish oil-omega-3 fatty acids 1000 MG capsule Take 2 g by mouth daily.     Yes [provider]  gemfibrozil (LOPID) 600 MG tablet TAKE 1 TABLET BY MOUTH 2 TIMES A DAY WITH MEALS. 07/12/19  Yes Burchette, Elberta Fortis, MD  pantoprazole (PROTONIX) 40 MG tablet Take 1 tablet (40 mg total) by mouth daily. 07/12/19  Yes Burchette, Elberta Fortis, MD  Diclofenac Sodium (PENNSAID) 2 % SOLN Place 1 application onto the skin 2 (two) times daily. Patient not taking: Reported on 02/22/2020 01/13/20   Rodolph Bong, MD  Diclofenac Sodium 2 % SOLN Place 2 g onto the skin 2 (two) times daily. Patient not taking: Reported on 02/22/2020 08/06/18   Judi Saa, DO  ibuprofen (ADVIL) 600 MG tablet Take 1 tablet (600 mg total) by mouth every 6 (six) hours as  needed for up to 30 doses for mild pain or moderate pain. 02/22/20   Terald Sleeper, MD  oxyCODONE-acetaminophen (PERCOCET/ROXICET) 5-325 MG tablet Take 2 tablets by mouth every 6 (six) hours as needed for up to 10 doses for severe pain. 02/22/20   Terald Sleeper, MD    Allergies    Amoxicillin-pot clavulanate  Review of Systems   Review of Systems  Constitutional: Negative for chills and fever.  Respiratory: Negative for cough and shortness of breath.   Cardiovascular: Negative for chest pain and palpitations.  Gastrointestinal: Positive for nausea. Negative for vomiting.  Genitourinary: Negative for dysuria and hematuria.  Musculoskeletal: Positive for arthralgias and myalgias.  Skin: Negative for rash and wound.  Neurological: Positive for numbness. Negative for weakness.  Psychiatric/Behavioral: Negative for agitation and confusion.  All other systems reviewed and are negative.   Physical Exam Updated Vital Signs BP (!) 127/98 (BP Location: Left Arm)   Pulse 63   Temp 98.2 F (36.8 C) (Oral)   Resp 17   Ht 5\' 10"  (1.778 m)   Wt 104.3 kg   SpO2 96%   BMI 33.00 kg/m   Physical Exam Vitals and nursing note reviewed.  Constitutional:      Appearance: He is well-developed.  HENT:     Head: Normocephalic and atraumatic.  Eyes:     Conjunctiva/sclera: Conjunctivae normal.  Cardiovascular:     Rate and Rhythm: Normal rate and regular rhythm.     Pulses: Normal pulses.  Pulmonary:     Effort: Pulmonary effort is normal. No respiratory distress.     Breath sounds: Normal breath sounds.  Musculoskeletal:     Cervical back: Neck supple.     Comments: Mild diffuse swelling of the patella with minor peripatellar effusion No focal ttp of the patellar head or fibular head No patellar dislocation ROM exam of the left knee limited due to pain Pedal pulse present in left foot but weaker than right pedal pulse  Skin:    General: Skin is warm and dry.  Neurological:      Mental Status: He is alert.     Comments: Paresthesias in left foot, no anesthesia, not in dermatomal distribution 5/5 strength in toe flexion, ankle flexion and extension Limited knee strength assessment 2/2 pain     ED Results / Procedures / Treatments   Labs (all labs ordered are listed, but only abnormal results are displayed) Labs Reviewed  BASIC METABOLIC PANEL - Abnormal; Notable for the following components:      Result Value   Glucose, Bld 126 (*)    All other components within normal limits  EKG None  Radiology CT ANGIO LOW EXTREM LEFT W &/OR WO CONTRAST  Result Date: 02/22/2020 CLINICAL DATA:  54 year old with history of knee dislocation. Evaluate for arterial injury. EXAM: CT ANGIOGRAPHY LOWER LEFT EXTREMITY TECHNIQUE: CTA images were obtained only of the left knee region. CONTRAST:  100mL OMNIPAQUE IOHEXOL 350 MG/ML SOLN COMPARISON:  Left knee radiographs 02/22/2020 FINDINGS: Arterial structures: Left popliteal artery is patent. Proximal anterior tibial artery is patent. Visualized portion of the tibioperoneal trunk is patent. No evidence for arterial aneurysm or perivascular hematoma. Venous structures: No contrast in the venous structures during the arterial phase of contrast but no gross abnormality to the venous structures. Nonvascular structures. Small joint effusion. Left knee is located without a fracture. Review of the MIP images confirms the above findings. IMPRESSION: 1. No arterial injury to the left popliteal artery. Left popliteal artery is widely patent. 2. Small left knee joint effusion. No acute bone abnormality in left knee. Electronically Signed   By: Richarda OverlieAdam  Henn M.D.   On: 02/22/2020 11:59   DG Knee Complete 4 Views Left  Result Date: 02/22/2020 CLINICAL DATA:  54 year old male with history of left knee pain. EXAM: LEFT KNEE - COMPLETE 4+ VIEW COMPARISON:  01/13/2020. FINDINGS: No evidence of fracture, dislocation, or joint effusion. No evidence of  arthropathy or other focal bone abnormality. Soft tissues are unremarkable. IMPRESSION: Negative. Electronically Signed   By: Trudie Reedaniel  Entrikin M.D.   On: 02/22/2020 08:04    Procedures Procedures (including critical care time)  Medications Ordered in ED Medications  ibuprofen (ADVIL) tablet 800 mg (800 mg Oral Given 02/22/20 0809)  oxyCODONE-acetaminophen (PERCOCET/ROXICET) 5-325 MG per tablet 1 tablet (1 tablet Oral Given 02/22/20 0809)  predniSONE (DELTASONE) tablet 60 mg (60 mg Oral Given 02/22/20 0809)  iohexol (OMNIPAQUE) 350 MG/ML injection 100 mL (100 mLs Intravenous Contrast Given 02/22/20 1014)    ED Course  I have reviewed the triage vital signs and the nursing notes.  Pertinent labs & imaging results that were available during my care of the patient were reviewed by me and considered in my medical decision making (see chart for details).  54 year old male presenting with left knee pain after a buckling incident yesterday.  Reporting diffuse pain around the knee.  Small effusion palpable with significant muscular tenderness and spasms.  No evidence of patellar alta or tendon rupture on my exam in the ED, although ROM is limited due to pain.  Despite paresthesias in his left foot and appears to have a diminished pulse in the left foot (although it is present) compared to the right foot.  This may be result of chronic or longstanding vascular disease.  However collectively these symptoms raise some concern for a neurovascular injury.  His mechanism does not specifically imply a posterior dislocation.  But I discussed with him the possibility of his injury and I do recommend a CTA at this time to evaluate the vasculature.  The patient is in agreement.  Xrays obtained showing no acute fracture itself tissue swelling  PO percocet, motrin, and prednisone ordered  Clinical Course as of Feb 21 1802  Tue Feb 22, 2020  0952 Patient reporting that the pain is throbbing has improved  significantly after his p.o. medications.  He is awaiting CT scan.  If this is negative he will go home with a knee immobilizer   [MT]  1013 En route to CT now   [MT]  1211 CT negative.  Pain is improved.  Will apply knee  immobilizer and discharge patient.  He is agreeable with this plan.  Follow-up with orthopedist   [MT]    Clinical Course User Index [MT] Muzammil Bruins, Kermit Balo, MD    Final Clinical Impression(s) / ED Diagnoses Final diagnoses:  Acute pain of left knee    Rx / DC Orders ED Discharge Orders         Ordered    oxyCODONE-acetaminophen (PERCOCET/ROXICET) 5-325 MG tablet  Every 6 hours PRN     Discontinue  Reprint     02/22/20 1211    ibuprofen (ADVIL) 600 MG tablet  Every 6 hours PRN     Discontinue  Reprint     02/22/20 1211           Terald Sleeper, MD 02/22/20 [phone]

## 2020-04-13 ENCOUNTER — Ambulatory Visit: Payer: BC Managed Care – PPO | Attending: Internal Medicine

## 2020-04-13 DIAGNOSIS — Z23 Encounter for immunization: Secondary | ICD-10-CM

## 2020-04-13 NOTE — Progress Notes (Signed)
   Covid-19 Vaccination Clinic  Name:  Andrew Shields    MRN: [id number] DOB: 09/19/1965  04/13/2020  Mr. Andrew Shields was observed post Covid-19 immunization for 15 minutes without incident. He was provided with Vaccine Information Sheet and instruction to access the V-Safe system.   Mr. Andrew Shields was instructed to call 911 with any severe reactions post vaccine: Marland Kitchen Difficulty breathing  . Swelling of face and throat  . A fast heartbeat  . A bad rash all over body  . Dizziness and weakness   Immunizations Administered    Name Date Dose VIS Date Route   Pfizer COVID-19 Vaccine 04/13/2020  2:37 PM 0.3 mL 10/20/2018 Intramuscular   Manufacturer: [employer], [employer]   Lot: [id number]   NDC: [address]-1

## 2020-05-04 ENCOUNTER — Ambulatory Visit: Payer: BC Managed Care – PPO | Attending: Internal Medicine

## 2020-05-04 DIAGNOSIS — Z23 Encounter for immunization: Secondary | ICD-10-CM

## 2020-05-04 NOTE — Progress Notes (Signed)
   Covid-19 Vaccination Clinic  Name:  Andrew Shields    MRN: [id number] DOB: Jun 03, 1966  05/04/2020  [name] was observed post Covid-19 immunization for 30 minutes based on pre-vaccination screening without incident. He was provided with Vaccine Information Sheet and instruction to access the V-Safe system.   [name] was instructed to call 911 with any severe reactions post vaccine: Marland Kitchen Difficulty breathing  . Swelling of face and throat  . A fast heartbeat  . A bad rash all over body  . Dizziness and weakness   Immunizations Administered    Name Date Dose VIS Date Route   Pfizer COVID-19 Vaccine 05/04/2020  2:05 PM 0.3 mL 10/20/2018 Intramuscular   Manufacturer: [employer], [employer]   Lot: [id number]   NDC: [address]-1

## 2020-07-26 ENCOUNTER — Ambulatory Visit (INDEPENDENT_AMBULATORY_CARE_PROVIDER_SITE_OTHER): Payer: BC Managed Care – PPO | Admitting: Family Medicine

## 2020-07-26 ENCOUNTER — Encounter: Payer: Self-pay | Admitting: Family Medicine

## 2020-07-26 ENCOUNTER — Other Ambulatory Visit: Payer: Self-pay

## 2020-07-26 VITALS — BP 110/70 | HR 59 | Temp 98.4°F | Ht 68.0 in | Wt 225.5 lb

## 2020-07-26 DIAGNOSIS — Z800 Family history of malignant neoplasm of digestive organs: Secondary | ICD-10-CM

## 2020-07-26 DIAGNOSIS — Z23 Encounter for immunization: Secondary | ICD-10-CM

## 2020-07-26 DIAGNOSIS — E785 Hyperlipidemia, unspecified: Secondary | ICD-10-CM

## 2020-07-26 DIAGNOSIS — Z0000 Encounter for general adult medical examination without abnormal findings: Secondary | ICD-10-CM | POA: Diagnosis not present

## 2020-07-26 LAB — CBC WITH DIFFERENTIAL/PLATELET
Basophils Absolute: 0 10*3/uL (ref 0.0–0.1)
Basophils Relative: 0.8 % (ref 0.0–3.0)
Eosinophils Absolute: 0.1 10*3/uL (ref 0.0–0.7)
Eosinophils Relative: 1.8 % (ref 0.0–5.0)
HCT: 43.8 % (ref 39.0–52.0)
Hemoglobin: 14.5 g/dL (ref 13.0–17.0)
Lymphocytes Relative: 43.6 % (ref 12.0–46.0)
Lymphs Abs: 2.1 10*3/uL (ref 0.7–4.0)
MCHC: 33.1 g/dL (ref 30.0–36.0)
MCV: 92.6 fl (ref 78.0–100.0)
Monocytes Absolute: 0.5 10*3/uL (ref 0.1–1.0)
Monocytes Relative: 9.4 % (ref 3.0–12.0)
Neutro Abs: 2.2 10*3/uL (ref 1.4–7.7)
Neutrophils Relative %: 44.4 % (ref 43.0–77.0)
Platelets: 259 10*3/uL (ref 150.0–400.0)
RBC: 4.73 Mil/uL (ref 4.22–5.81)
RDW: 13.5 % (ref 11.5–15.5)
WBC: 4.9 10*3/uL (ref 4.0–10.5)

## 2020-07-26 LAB — BASIC METABOLIC PANEL
BUN: 17 mg/dL (ref 6–23)
CO2: 28 mEq/L (ref 19–32)
Calcium: 9.7 mg/dL (ref 8.4–10.5)
Chloride: 104 mEq/L (ref 96–112)
Creatinine, Ser: 0.97 mg/dL (ref 0.40–1.50)
GFR: 87.55 mL/min (ref >60.00)
Glucose, Bld: 123 mg/dL — ABNORMAL HIGH (ref 70–99)
Potassium: 4.8 mEq/L (ref 3.5–5.1)
Sodium: 139 mEq/L (ref 135–145)

## 2020-07-26 LAB — HEPATIC FUNCTION PANEL
ALT: 18 U/L (ref 0–53)
AST: 18 U/L (ref 0–37)
Albumin: 4.6 g/dL (ref 3.5–5.2)
Alkaline Phosphatase: 67 U/L (ref 39–117)
Bilirubin, Direct: 0.1 mg/dL (ref 0.0–0.3)
Total Bilirubin: 0.7 mg/dL (ref 0.2–1.2)
Total Protein: 7.1 g/dL (ref 6.0–8.3)

## 2020-07-26 LAB — VITAMIN B12: Vitamin B-12: 162 pg/mL — ABNORMAL LOW (ref 211–911)

## 2020-07-26 LAB — LIPID PANEL
Cholesterol: 234 mg/dL — ABNORMAL HIGH (ref 0–200)
HDL: 40.5 mg/dL (ref >39.00)
NonHDL: 193.43
Total CHOL/HDL Ratio: 6
Triglycerides: 252 mg/dL — ABNORMAL HIGH (ref 0.0–149.0)
VLDL: 50.4 mg/dL — ABNORMAL HIGH (ref 0.0–40.0)

## 2020-07-26 LAB — LDL CHOLESTEROL, DIRECT: Direct LDL: 158 mg/dL

## 2020-07-26 LAB — PSA: PSA: 0.49 ng/mL (ref 0.10–4.00)

## 2020-07-26 LAB — TSH: TSH: 2.31 u[IU]/mL (ref 0.35–4.50)

## 2020-07-26 MED ORDER — PANTOPRAZOLE SODIUM 40 MG PO TBEC: 40.0000 mg | DELAYED_RELEASE_TABLET | Freq: Every day | ORAL | 3 refills | Status: DC

## 2020-07-26 MED ORDER — GEMFIBROZIL 600 MG PO TABS: ORAL_TABLET | ORAL | 3 refills | Status: DC

## 2020-07-26 NOTE — Patient Instructions (Signed)
Preventive Care 56-56 Years Old, Male Preventive care refers to lifestyle choices and visits with your health care provider that can promote health and wellness. This includes:  A yearly physical exam. This is also called an annual well check.  Regular dental and eye exams.  Immunizations.  Screening for certain conditions.  Healthy lifestyle choices, such as eating a healthy diet, getting regular exercise, not using drugs or products that contain nicotine and tobacco, and limiting alcohol use. What can I expect for my preventive care visit? Physical exam Your health care provider will check:  Height and weight. These may be used to calculate body mass index (BMI), which is a measurement that tells if you are at a healthy weight.  Heart rate and blood pressure.  Your skin for abnormal spots. Counseling Your health care provider may ask you questions about:  Alcohol, tobacco, and drug use.  Emotional well-being.  Home and relationship well-being.  Sexual activity.  Eating habits.  Work and work Statistician. What immunizations do I need?  Influenza (flu) vaccine  This is recommended every year. Tetanus, diphtheria, and pertussis (Tdap) vaccine  You may need a Td booster every 10 years. Varicella (chickenpox) vaccine  You may need this vaccine if you have not already been vaccinated. Zoster (shingles) vaccine  You may need this after age 56. Measles, mumps, and rubella (MMR) vaccine  You may need at least one dose of MMR if you were born in 1957 or later. You may also need a second dose. Pneumococcal conjugate (PCV13) vaccine  You may need this if you have certain conditions and were not previously vaccinated. Pneumococcal polysaccharide (PPSV23) vaccine  You may need one or two doses if you smoke cigarettes or if you have certain conditions. Meningococcal conjugate (MenACWY) vaccine  You may need this if you have certain conditions. Hepatitis A  vaccine  You may need this if you have certain conditions or if you travel or work in places where you may be exposed to hepatitis A. Hepatitis B vaccine  You may need this if you have certain conditions or if you travel or work in places where you may be exposed to hepatitis B. Haemophilus influenzae type b (Hib) vaccine  You may need this if you have certain risk factors. Human papillomavirus (HPV) vaccine  If recommended by your health care provider, you may need three doses over 6 months. You may receive vaccines as individual doses or as more than one vaccine together in one shot (combination vaccines). Talk with your health care provider about the risks and benefits of combination vaccines. What tests do I need? Blood tests  Lipid and cholesterol levels. These may be checked every 5 years, or more frequently if you are over 56 years old.  Hepatitis C test.  Hepatitis B test. Screening  Lung cancer screening. You may have this screening every year starting at age 56 if you have a 30-pack-year history of smoking and currently smoke or have quit within the past 15 years.  Prostate cancer screening. Recommendations will vary depending on your family history and other risks.  Colorectal cancer screening. All adults should have this screening starting at age 56 and continuing until age 2. Your health care provider may recommend screening at age 56 if you are at increased risk. You will have tests every 1-10 years, depending on your results and the type of screening test.  Diabetes screening. This is done by checking your blood sugar (glucose) after you have not eaten  for a while (fasting). You may have this done every 1-3 years.  Sexually transmitted disease (STD) testing. Follow these instructions at home: Eating and drinking  Eat a diet that includes fresh fruits and vegetables, whole grains, lean protein, and low-fat dairy products.  Take vitamin and mineral supplements as  recommended by your health care provider.  Do not drink alcohol if your health care provider tells you not to drink.  If you drink alcohol: ? Limit how much you have to 0-2 drinks a day. ? Be aware of how much alcohol is in your drink. In the [place] one drink equals one 12 oz bottle of beer (355 mL), one 5 oz glass of wine (148 mL), or one 1 oz glass of hard liquor (44 mL). Lifestyle  Take daily care of your teeth and gums.  Stay active. Exercise for at least 30 minutes on 5 or more days each week.  Do not use any products that contain nicotine or tobacco, such as cigarettes, e-cigarettes, and chewing tobacco. If you need help quitting, ask your health care provider.  If you are sexually active, practice safe sex. Use a condom or other form of protection to prevent STIs (sexually transmitted infections).  Talk with your health care provider about taking a low-dose aspirin every day starting at age 56. What's next?  Go to your health care provider once a year for a well check visit.  Ask your health care provider how often you should have your eyes and teeth checked.  Stay up to date on all vaccines. This information is not intended to replace advice given to you by your health care provider. Make sure you discuss any questions you have with your health care provider. Document Revised: 08/06/2018 Document Reviewed: 08/06/2018 Elsevier Patient Education  2020 [employer].

## 2020-07-26 NOTE — Progress Notes (Addendum)
Established Patient Office Visit  Subjective:  Patient ID: Andrew Shields, male    DOB: 11-Dec-1965  Age: 54 y.o. MRN: [id number]  CC:  Chief Complaint  Patient presents with  . Annual Exam    HPI Andrew Shields presents for physical exam.  He retired from police work couple years ago.  He is staying fairly busy in retirement.  His wife still works full-time.  Has history of GERD and past history of erosive esophagitis.  Is on chronic Protonix therapy.  He also has history of dyslipidemia with high triglycerides and has been on Lopid for several years.  He has had no specific complaints recently.  Mother had colon cancer and his last colonoscopies 5 years ago.  He needs to get rescheduled.  Father had lung cancer and was a non-smoker.  He died of lung cancer at age 55.  Social history-married.  No children.  Retired from career in police work.  Never smoked.  No alcohol.  Health maintenance reviewed- -needs tetanus booster and flu vaccine.  Otherwise vaccines up-to-date -Needs repeat colonoscopy as above -Has received Covid vaccines  The 10-year ASCVD risk score Denman George DC Jr., et al., 2013) is: 6.7%   Values used to calculate the score:     Age: 66 years     Sex: Male     Is Non-Hispanic African American: No     Diabetic: No     Tobacco smoker: No     Systolic Blood Pressure: 110 mmHg     Is BP treated: No     HDL Cholesterol: 40.5 mg/dL     Total Cholesterol: 234 mg/dL   Past Medical History:  Diagnosis Date  . Hyperlipidemia     Past Surgical History:  Procedure Laterality Date  . CARPAL TUNNEL RELEASE Right   . COLONOSCOPY  04/19/2010   RMR: 1. normal rectum 2. normal colon. 3. Normal terminal ileum   . COLONOSCOPY N/A 06/06/2015   Dr. Elmer Ramp. Surveillance in 5 years   . ESOPHAGEAL DILATION N/A 06/06/2015   Procedure: ESOPHAGEAL DILATION;  Surgeon: Corbin Ade, MD;  Location: [facility];  Service: Endoscopy;  Laterality: N/A;  .  ESOPHAGOGASTRODUODENOSCOPY N/A 06/06/2015   Dr. Rourk:mild erosive reflux/HH/s/p empiric dilation  . TONSILLECTOMY      Family History  Problem Relation Age of Onset  . Colon cancer Mother        age late 2s  . Heart disease Mother 85       stent  . Diabetes Mother        type ll  . Alcohol abuse Father   . Cancer Father        lung  . Hyperlipidemia Brother   . Diabetes Sister        ?type 2    Social History   Socioeconomic History  . Marital status: Married    Spouse name: Not on file  . Number of children: 0  . Years of education: Not on file  . Highest education level: Not on file  Occupational History  . Not on file  Tobacco Use  . Smoking status: Never Smoker  . Smokeless tobacco: Never Used  Vaping Use  . Vaping Use: Never used  Substance and Sexual Activity  . Alcohol use: No    Alcohol/week: 0.0 standard drinks  . Drug use: No  . Sexual activity: Not on file  Other Topics Concern  . Not on file  Social History Narrative  .  Not on file   Social Determinants of Health   Financial Resource Strain:   . Difficulty of Paying Living Expenses: Not on file  Food Insecurity:   . Worried About Programme researcher, broadcasting/film/video in the Last Year: Not on file  . Ran Out of Food in the Last Year: Not on file  Transportation Needs:   . Lack of Transportation (Medical): Not on file  . Lack of Transportation (Non-Medical): Not on file  Physical Activity:   . Days of Exercise per Week: Not on file  . Minutes of Exercise per Session: Not on file  Stress:   . Feeling of Stress : Not on file  Social Connections:   . Frequency of Communication with Friends and Family: Not on file  . Frequency of Social Gatherings with Friends and Family: Not on file  . Attends Religious Services: Not on file  . Active Member of Clubs or Organizations: Not on file  . Attends Banker Meetings: Not on file  . Marital Status: Not on file  Intimate Partner Violence:   . Fear of  Current or Ex-Partner: Not on file  . Emotionally Abused: Not on file  . Physically Abused: Not on file  . Sexually Abused: Not on file    Outpatient Medications Prior to Visit  Medication Sig Dispense Refill  . fish oil-omega-3 fatty acids 1000 MG capsule Take 2 g by mouth daily.      Marland Kitchen ibuprofen (ADVIL) 600 MG tablet Take 1 tablet (600 mg total) by mouth every 6 (six) hours as needed for up to 30 doses for mild pain or moderate pain. 30 tablet 0  . gemfibrozil (LOPID) 600 MG tablet TAKE 1 TABLET BY MOUTH 2 TIMES A DAY WITH MEALS. 180 tablet 3  . pantoprazole (PROTONIX) 40 MG tablet Take 1 tablet (40 mg total) by mouth daily. 90 tablet 3  . Diclofenac Sodium (PENNSAID) 2 % SOLN Place 1 application onto the skin 2 (two) times daily. (Patient not taking: Reported on 02/22/2020) 112 g 2  . Diclofenac Sodium 2 % SOLN Place 2 g onto the skin 2 (two) times daily. (Patient not taking: Reported on 02/22/2020) 112 g 3  . oxyCODONE-acetaminophen (PERCOCET/ROXICET) 5-325 MG tablet Take 2 tablets by mouth every 6 (six) hours as needed for up to 10 doses for severe pain. 10 tablet 0   No facility-administered medications prior to visit.    Allergies  Allergen Reactions  . Amoxicillin-Pot Clavulanate     REACTION: difficulty breathing    ROS Review of Systems  Constitutional: Negative for activity change, appetite change, fatigue and fever.  HENT: Negative for congestion, ear pain and trouble swallowing.   Eyes: Negative for pain and visual disturbance.  Respiratory: Negative for cough, shortness of breath and wheezing.   Cardiovascular: Negative for chest pain and palpitations.  Gastrointestinal: Negative for abdominal distention, abdominal pain, blood in stool, constipation, diarrhea, nausea, rectal pain and vomiting.  Genitourinary: Negative for dysuria, hematuria and testicular pain.  Musculoskeletal: Negative for arthralgias and joint swelling.  Skin: Negative for rash.  Neurological:  Negative for dizziness, syncope and headaches.  Hematological: Negative for adenopathy.  Psychiatric/Behavioral: Negative for confusion and dysphoric mood.      Objective:    Physical Exam Constitutional:      General: He is not in acute distress.    Appearance: He is well-developed.  HENT:     Head: Normocephalic and atraumatic.     Right Ear: External ear normal.  Left Ear: External ear normal.  Eyes:     Conjunctiva/sclera: Conjunctivae normal.     Pupils: Pupils are equal, round, and reactive to light.  Neck:     Thyroid: No thyromegaly.  Cardiovascular:     Rate and Rhythm: Normal rate and regular rhythm.     Heart sounds: Normal heart sounds. No murmur heard.   Pulmonary:     Effort: No respiratory distress.     Breath sounds: No wheezing or rales.  Abdominal:     General: Bowel sounds are normal. There is no distension.     Palpations: Abdomen is soft. There is no mass.     Tenderness: There is no abdominal tenderness. There is no guarding or rebound.  Musculoskeletal:     Cervical back: Normal range of motion and neck supple.     Right lower leg: No edema.     Left lower leg: No edema.  Lymphadenopathy:     Cervical: No cervical adenopathy.  Skin:    Findings: No rash.  Neurological:     Mental Status: He is alert and oriented to person, place, and time.     Cranial Nerves: No cranial nerve deficit.     Deep Tendon Reflexes: Reflexes normal.  Psychiatric:        Mood and Affect: Mood normal.     BP 110/70 (BP Location: Right Arm, Patient Position: Sitting, Cuff Size: Large)   Pulse (!) 59   Temp 98.4 F (36.9 C) (Oral)   Ht 5\' 8"  (1.727 m)   Wt 225 lb 8 oz (102.3 kg)   SpO2 97%   BMI 34.29 kg/m  Wt Readings from Last 3 Encounters:  07/26/20 225 lb 8 oz (102.3 kg)  02/22/20 230 lb (104.3 kg)  01/13/20 230 lb (104.3 kg)     Health Maintenance Due  Topic Date Due  . HIV Screening  Never done  . TETANUS/TDAP  03/21/2020  . INFLUENZA VACCINE   03/26/2020    There are no preventive care reminders to display for this patient.  Lab Results  Component Value Date   TSH 3.52 07/12/2019   Lab Results  Component Value Date   WBC 7.1 07/12/2019   HGB 15.3 07/12/2019   HCT 45.9 07/12/2019   MCV 92.5 07/12/2019   PLT 243.0 07/12/2019   Lab Results  Component Value Date   NA 139 02/22/2020   K 3.8 02/22/2020   CO2 26 02/22/2020   GLUCOSE 126 (H) 02/22/2020   BUN 17 02/22/2020   CREATININE 0.79 02/22/2020   BILITOT 0.7 07/12/2019   ALKPHOS 61 07/12/2019   AST 18 07/12/2019   ALT 23 07/12/2019   PROT 7.3 07/12/2019   ALBUMIN 4.8 07/12/2019   CALCIUM 9.5 02/22/2020   ANIONGAP 13 02/22/2020   GFR 85.41 07/12/2019   Lab Results  Component Value Date   CHOL 234 (H) 07/12/2019   Lab Results  Component Value Date   HDL 34.40 (L) 07/12/2019   Lab Results  Component Value Date   LDLCALC 128 (H) 08/28/2012   Lab Results  Component Value Date   TRIG (H) 07/12/2019    540.0 Triglyceride is over 400; calculations on Lipids are invalid.   Lab Results  Component Value Date   CHOLHDL 7 07/12/2019   No results found for: HGBA1C    Assessment & Plan:   Problem List Items Addressed This Visit      Unprioritized   Family history of colon cancer - Primary  Relevant Orders   Ambulatory referral to Gastroenterology    Other Visit Diagnoses    Dyslipidemia       Relevant Medications   gemfibrozil (LOPID) 600 MG tablet   Physical exam       Relevant Orders   Basic metabolic panel   Lipid panel   CBC with Differential/Platelet   TSH   Hepatic function panel   PSA   Vitamin B12    -Referral made for repeat colonoscopy -Flu vaccine and Tdap given -Continue regular exercise habits -We will check lab work as above.  Include B12 level with chronic PPI use.  Patient also relates some very fleeting intermittent paresthesias involving extremities  Meds ordered this encounter  Medications  . pantoprazole  (PROTONIX) 40 MG tablet    Sig: Take 1 tablet (40 mg total) by mouth daily.    Dispense:  90 tablet    Refill:  3  . gemfibrozil (LOPID) 600 MG tablet    Sig: TAKE 1 TABLET BY MOUTH 2 TIMES A DAY WITH MEALS.    Dispense:  180 tablet    Refill:  3    Follow-up: No follow-ups on file.    Evelena PeatBruce Crysten Kaman, MD

## 2020-07-26 NOTE — Addendum Note (Signed)
Addended by: Lerry Liner on: 07/26/2020 07:36 AM   Modules accepted: Orders

## 2020-07-26 NOTE — Addendum Note (Signed)
Addended by: Judd Gaudier on: 07/26/2020 07:37 AM   Modules accepted: Orders

## 2020-07-26 NOTE — Addendum Note (Signed)
Addended by: Lerry Liner on: 07/26/2020 07:35 AM   Modules accepted: Orders

## 2020-07-28 ENCOUNTER — Encounter: Payer: Self-pay | Admitting: Internal Medicine

## 2020-07-31 ENCOUNTER — Telehealth: Payer: Self-pay

## 2020-07-31 NOTE — Telephone Encounter (Signed)
-----   Message from [name], MD sent at 07/27/2020  6:55 PM EST ----- Multiple abnormalities:  low B12, glucose in the "prediabetes" range, elevated cholesterol of 234.  PSA and other labs remain normal. Recommend start daily OTC B12 1,000 mcg and keep sugar and starch consumption down.  Try to lose some weight.  Recommend 3 month follow up and will repeat B12 and also check A1C then.

## 2020-07-31 NOTE — Telephone Encounter (Signed)
Left message for patient to call or send mychart message regarding results and recommendations.

## 2020-08-01 NOTE — Telephone Encounter (Signed)
Informed patient of results and recommendations. 
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
## 2020-09-08 ENCOUNTER — Ambulatory Visit: Payer: BC Managed Care – PPO | Admitting: Gastroenterology

## 2020-09-08 ENCOUNTER — Encounter: Payer: Self-pay | Admitting: *Deleted

## 2020-09-08 ENCOUNTER — Encounter: Payer: Self-pay | Admitting: Gastroenterology

## 2020-09-08 ENCOUNTER — Other Ambulatory Visit: Payer: Self-pay

## 2020-09-08 VITALS — BP 126/82 | HR 86 | Temp 97.3°F | Ht 68.0 in | Wt 225.6 lb

## 2020-09-08 DIAGNOSIS — Z800 Family history of malignant neoplasm of digestive organs: Secondary | ICD-10-CM

## 2020-09-08 NOTE — Progress Notes (Signed)
Primary Care Physician:  [name], MD  Primary Gastroenterologist:  [name], MD   Chief Complaint  Patient presents with  . Colonoscopy    HPI:  Andrew Shields is a 55 y.o. male here to schedule high rescreening colonoscopy. His last colonoscopy was in 2016, normal.  His mother had colon cancer in her 42s.  Bowel movements regular.  He has multiple stools daily, typically 2-3 stools every morning.  Sometimes postprandial stools.  Has had this off and on in the past.  States he is on Metamucil "kick".  Seems to have firmed up his stool somewhat.  Using Metamucil to suppress his appetite and to help with glucose management.  Denies nocturnal BMs.  No bright red blood per rectum or melena.  Sometimes the stools are reddish tinted.  Denies abdominal pain.  Heartburn reasonably well controlled on pantoprazole.  Recently noted to have B12 deficiency.  Started on over-the-counter B12 drops daily.  Plans to recheck B12 in 3 months per PCP.  Current Outpatient Medications  Medication Sig Dispense Refill  . fish oil-omega-3 fatty acids 1000 MG capsule Take 2 g by mouth daily.    Marland Kitchen gemfibrozil (LOPID) 600 MG tablet TAKE 1 TABLET BY MOUTH 2 TIMES A DAY WITH MEALS. 180 tablet 3  . pantoprazole (PROTONIX) 40 MG tablet Take 1 tablet (40 mg total) by mouth daily. 90 tablet 3   No current facility-administered medications for this visit.    Allergies as of 09/08/2020 - Review Complete 09/08/2020  Allergen Reaction Noted  . Amoxicillin-pot clavulanate  03/21/2010    Past Medical History:  Diagnosis Date  . Hyperlipidemia     Past Surgical History:  Procedure Laterality Date  . CARPAL TUNNEL RELEASE Right   . COLONOSCOPY  04/19/2010   RMR: 1. normal rectum 2. normal colon. 3. Normal terminal ileum   . COLONOSCOPY N/A 06/06/2015   Dr. Elmer Ramp. Surveillance in 5 years   . ESOPHAGEAL DILATION N/A 06/06/2015   Procedure: ESOPHAGEAL DILATION;  Surgeon: Corbin Ade, MD;   Location: [facility];  Service: Endoscopy;  Laterality: N/A;  . ESOPHAGOGASTRODUODENOSCOPY N/A 06/06/2015   Dr. Rourk:mild erosive reflux/HH/s/p empiric dilation  . TONSILLECTOMY      Family History  Problem Relation Age of Onset  . Colon cancer Mother        age late 99s  . Heart disease Mother 67       stent  . Diabetes Mother        type ll  . Alcohol abuse Father   . Cancer Father 49       lung  . Hyperlipidemia Brother   . Diabetes Sister        ?type 2    Social History   Socioeconomic History  . Marital status: Married    Spouse name: Not on file  . Number of children: 0  . Years of education: Not on file  . Highest education level: Not on file  Occupational History  . Not on file  Tobacco Use  . Smoking status: Never Smoker  . Smokeless tobacco: Never Used  Vaping Use  . Vaping Use: Never used  Substance and Sexual Activity  . Alcohol use: No    Alcohol/week: 0.0 standard drinks  . Drug use: No  . Sexual activity: Not on file  Other Topics Concern  . Not on file  Social History Narrative  . Not on file   Social Determinants of Health   Financial  Resource Strain: Not on file  Food Insecurity: Not on file  Transportation Needs: Not on file  Physical Activity: Not on file  Stress: Not on file  Social Connections: Not on file  Intimate Partner Violence: Not on file      ROS:  General: Negative for anorexia, weight loss, fever, chills.  Positive fatigue. Eyes: Negative for vision changes.  ENT: Negative for hoarseness, difficulty swallowing , nasal congestion. CV: Negative for chest pain, angina, palpitations, dyspnea on exertion, peripheral edema.  Respiratory: Negative for dyspnea at rest, dyspnea on exertion, cough, sputum, wheezing.  GI: See history of present illness. GU:  Negative for dysuria, hematuria, urinary incontinence, urinary frequency, nocturnal urination.  MS: Negative for joint pain, low back pain.  Derm: Negative for rash or  itching.  Neuro: Negative for weakness, abnormal sensation, seizure, frequent headaches, memory loss, confusion.  Psych: Negative for anxiety, depression, suicidal ideation, hallucinations.  Endo: Negative for unusual weight change.  Heme: Negative for bruising or bleeding. Allergy: Negative for rash or hives.    Physical Examination:  BP 126/82   Pulse 86   Temp (!) 97.3 F (36.3 C) (Temporal)   Ht 5\' 8"  (1.727 m)   Wt 225 lb 9.6 oz (102.3 kg)   BMI 34.30 kg/m    General: Well-nourished, well-developed in no acute distress.  Head: Normocephalic, atraumatic.   Eyes: Conjunctiva pink, no icterus. Mouth: Masked Neck: Supple without thyromegaly, masses, or lymphadenopathy.  Lungs: Clear to auscultation bilaterally.  Heart: Regular rate and rhythm, no murmurs rubs or gallops.  Abdomen: Bowel sounds are normal, nontender, nondistended, no hepatosplenomegaly or masses, no abdominal bruits or    hernia , no rebound or guarding.   Rectal: Not performed Extremities: No lower extremity edema. No clubbing or deformities.  Neuro: Alert and oriented x 4 , grossly normal neurologically.  Skin: Warm and dry, no rash or jaundice.   Psych: Alert and cooperative, normal mood and affect.  Labs: Lab Results  Component Value Date   CREATININE 0.97 07/26/2020   BUN 17 07/26/2020   NA 139 07/26/2020   K 4.8 07/26/2020   CL 104 07/26/2020   CO2 28 07/26/2020   Lab Results  Component Value Date   ALT 18 07/26/2020   AST 18 07/26/2020   ALKPHOS 67 07/26/2020   BILITOT 0.7 07/26/2020   Lab Results  Component Value Date   WBC 4.9 07/26/2020   HGB 14.5 07/26/2020   HCT 43.8 07/26/2020   MCV 92.6 07/26/2020   PLT 259.0 07/26/2020   Lab Results  Component Value Date   VITAMINB12 162 (L) 07/26/2020   Lab Results  Component Value Date   TSH 2.31 07/26/2020     Imaging Studies: No results found.  Impression/plan:  Pleasant 55 year old male with family history of colon cancer  (mother in her 32s) presenting to schedule high risk screening colonoscopy.  Denies any recent change in bowel habits.  No overt GI bleeding.  Stool somewhat reddish tint at times but no frank bleeding.  No anemia.  Recommend high risk screening colonoscopy with Dr. 49s.  Plan for propofol given increased amounts of conscious sedation medication requirements.  ASA II.  I have discussed the risks, alternatives, benefits with regards to but not limited to the risk of reaction to medication, bleeding, infection, perforation and the patient is agreeable to proceed. Written consent to be obtained.

## 2020-09-08 NOTE — Patient Instructions (Signed)
1. Colonoscopy as scheduled. Please see separate instructions. 

## 2020-09-12 ENCOUNTER — Other Ambulatory Visit (HOSPITAL_COMMUNITY)
Admission: RE | Admit: 2020-09-12 | Discharge: 2020-09-12 | Disposition: A | Discharge status: Home / Self Care | Payer: BC Managed Care – PPO | Source: Ambulatory Visit | Attending: Internal Medicine | Admitting: Internal Medicine

## 2020-09-12 ENCOUNTER — Other Ambulatory Visit: Payer: Self-pay

## 2020-09-12 ENCOUNTER — Encounter (HOSPITAL_COMMUNITY): Payer: Self-pay | Admitting: Internal Medicine

## 2020-09-12 DIAGNOSIS — Z01812 Encounter for preprocedural laboratory examination: Secondary | ICD-10-CM | POA: Insufficient documentation

## 2020-09-12 DIAGNOSIS — U071 COVID-19: Secondary | ICD-10-CM | POA: Insufficient documentation

## 2020-09-12 LAB — SARS CORONAVIRUS 2 (TAT 6-24 HRS): SARS Coronavirus 2: POSITIVE — AB

## 2020-09-13 ENCOUNTER — Telehealth: Payer: Self-pay | Admitting: General Practice

## 2020-09-13 NOTE — Telephone Encounter (Addendum)
Patient called in stating he was positive for COVID and his procedure is scheduled for tomorrow.  Routing to clinical pool  The patient can be reached at [phone] or [phone]

## 2020-09-13 NOTE — Telephone Encounter (Signed)
Called pt, TCS rescheduled to 10/13/20 at 7:30am. He's aware he will not be retested for COVID since he was positive yesterday. New instructions mailed.  Endo scheduler informed.

## 2020-10-13 ENCOUNTER — Ambulatory Visit (HOSPITAL_COMMUNITY): Payer: BC Managed Care – PPO | Admitting: Anesthesiology

## 2020-10-13 ENCOUNTER — Other Ambulatory Visit: Payer: Self-pay

## 2020-10-13 ENCOUNTER — Encounter (HOSPITAL_COMMUNITY): Payer: Self-pay | Admitting: Internal Medicine

## 2020-10-13 ENCOUNTER — Ambulatory Visit (HOSPITAL_COMMUNITY)
Admission: RE | Admit: 2020-10-13 | Discharge: 2020-10-13 | Disposition: A | Discharge status: Home / Self Care | Payer: BC Managed Care – PPO | Attending: Internal Medicine | Admitting: Internal Medicine

## 2020-10-13 ENCOUNTER — Encounter (HOSPITAL_COMMUNITY)
Admission: RE | Disposition: A | Discharge status: Home / Self Care | Payer: Self-pay | Source: Home / Self Care | Attending: Internal Medicine

## 2020-10-13 DIAGNOSIS — Z1211 Encounter for screening for malignant neoplasm of colon: Secondary | ICD-10-CM

## 2020-10-13 DIAGNOSIS — Z800 Family history of malignant neoplasm of digestive organs: Secondary | ICD-10-CM | POA: Diagnosis not present

## 2020-10-13 DIAGNOSIS — K219 Gastro-esophageal reflux disease without esophagitis: Secondary | ICD-10-CM | POA: Diagnosis not present

## 2020-10-13 DIAGNOSIS — Z79899 Other long term (current) drug therapy: Secondary | ICD-10-CM | POA: Diagnosis not present

## 2020-10-13 HISTORY — PX: COLONOSCOPY WITH PROPOFOL: SHX5780

## 2020-10-13 SURGERY — COLONOSCOPY WITH PROPOFOL
Anesthesia: General

## 2020-10-13 MED ORDER — PROPOFOL 10 MG/ML IV BOLUS
INTRAVENOUS | Status: DC | PRN
  Administered 2020-10-13 (×2): 50 mg via INTRAVENOUS

## 2020-10-13 MED ORDER — PROPOFOL 10 MG/ML IV BOLUS
INTRAVENOUS | Status: AC
  Filled 2020-10-13: qty 80

## 2020-10-13 MED ORDER — STERILE WATER FOR IRRIGATION IR SOLN
Status: DC | PRN
  Administered 2020-10-13: 100 mL

## 2020-10-13 MED ORDER — PROPOFOL 500 MG/50ML IV EMUL
INTRAVENOUS | Status: DC | PRN
  Administered 2020-10-13: 150 ug/kg/min via INTRAVENOUS

## 2020-10-13 MED ORDER — LACTATED RINGERS IV SOLN: INTRAVENOUS | Status: DC

## 2020-10-13 NOTE — H&P (Signed)
@LOGO @   Primary Care Physician:  , MD Primary Gastroenterologist:  Dr. [name]  Pre-Procedure History & Physical: HPI:  Andrew Shields is a 55 y.o. male here for high risk screening colonoscopy.  Mother with colon cancer.  Negative exam 5 years ago  Past Medical History:  Diagnosis Date  . Hyperlipidemia     Past Surgical History:  Procedure Laterality Date  . CARPAL TUNNEL RELEASE Right   . COLONOSCOPY  04/19/2010   RMR: 1. normal rectum 2. normal colon. 3. Normal terminal ileum   . COLONOSCOPY N/A 06/06/2015   Dr. 08/06/2015. Surveillance in 5 years   . ESOPHAGEAL DILATION N/A 06/06/2015   Procedure: ESOPHAGEAL DILATION;  Surgeon: 08/06/2015, MD;  Location: [facility];  Service: Endoscopy;  Laterality: N/A;  . ESOPHAGOGASTRODUODENOSCOPY N/A 06/06/2015   Dr. Winfred Redel:mild erosive reflux/HH/s/p empiric dilation  . TONSILLECTOMY      Prior to Admission medications   Medication Sig Start Date End Date Taking? Authorizing Provider  acetaminophen (TYLENOL) 500 MG tablet Take 500 mg by mouth every 6 (six) hours as needed (pain.).   Yes [provider]  Cyanocobalamin (B-12) 5000 MCG SUBL Place 1 mL under the tongue daily.   Yes [provider]  gemfibrozil (LOPID) 600 MG tablet TAKE 1 TABLET BY MOUTH 2 TIMES A DAY WITH MEALS. Patient taking differently: Take 600 mg by mouth in the morning and at bedtime. TAKE 1 TABLET BY MOUTH 2 TIMES A DAY WITH MEALS. 07/26/20  Yes Burchette, 14/1/21, MD  Omega-3 Fatty Acids (FISH OIL) 1000 MG CAPS Take 1,000 mg by mouth in the morning and at bedtime.   Yes [provider]  pantoprazole (PROTONIX) 40 MG tablet Take 1 tablet (40 mg total) by mouth daily. 07/26/20  Yes Burchette, 14/1/21, MD    Allergies as of 09/08/2020 - Review Complete 09/08/2020  Allergen Reaction Noted  . Amoxicillin-pot clavulanate Shortness Of Breath and Other (See Comments) 03/21/2010    Family History  Problem Relation  Age of Onset  . Colon cancer Mother        age late 57s  . Heart disease Mother 11       stent  . Diabetes Mother        type ll  . Alcohol abuse Father   . Cancer Father 34       lung  . Hyperlipidemia Brother   . Diabetes Sister        ?type 2    Social History   Socioeconomic History  . Marital status: Married    Spouse name: Not on file  . Number of children: 0  . Years of education: Not on file  . Highest education level: Not on file  Occupational History  . Not on file  Tobacco Use  . Smoking status: Never Smoker  . Smokeless tobacco: Never Used  Vaping Use  . Vaping Use: Never used  Substance and Sexual Activity  . Alcohol use: No    Alcohol/week: 0.0 standard drinks  . Drug use: No  . Sexual activity: Not on file  Other Topics Concern  . Not on file  Social History Narrative  . Not on file   Social Determinants of Health   Financial Resource Strain: Not on file  Food Insecurity: Not on file  Transportation Needs: Not on file  Physical Activity: Not on file  Stress: Not on file  Social Connections: Not on file  Intimate Partner Violence: Not  on file    Review of Systems: See HPI, otherwise negative ROS  Physical Exam: BP 108/76   Pulse 62   Temp 98.3 F (36.8 C) (Oral)   Resp 13   Ht 5\' 8"  (1.727 m)   Wt 100.7 kg   SpO2 96%   BMI 33.75 kg/m  General:   Alert,  Well-developed, well-nourished, pleasant and cooperative in NAD Neck:  Supple; no masses or thyromegaly. No significant cervical adenopathy. Lungs:  Clear throughout to auscultation.   No wheezes, crackles, or rhonchi. No acute distress. Heart:  Regular rate and rhythm; no murmurs, clicks, rubs,  or gallops. Abdomen: Non-distended, normal bowel sounds.  Soft and nontender without appreciable mass or hepatosplenomegaly.  Pulses:  Normal pulses noted. Extremities:  Without clubbing or edema.  Impression/Plan: 55 year old gentleman here for high risk screening colonoscopy.  I have  offered the patient a screening colonoscopy today per plan.  The risks, benefits, limitations, alternatives and imponderables have been reviewed with the patient. Questions have been answered. All parties are agreeable.      Notice: This dictation was prepared with Dragon dictation along with smaller phrase technology. Any transcriptional errors that result from this process are unintentional and may not be corrected upon review.

## 2020-10-13 NOTE — Discharge Instructions (Signed)
  Colonoscopy Discharge Instructions  Read the instructions outlined below and refer to this sheet in the next few weeks. These discharge instructions provide you with general information on caring for yourself after you leave the hospital. Your doctor may also give you specific instructions. While your treatment has been planned according to the most current medical practices available, unavoidable complications occasionally occur. If you have any problems or questions after discharge, call Dr. Jena Gauss at [phone]. ACTIVITY  You may resume your regular activity, but move at a slower pace for the next 24 hours.   Take frequent rest periods for the next 24 hours.   Walking will help get rid of the air and reduce the bloated feeling in your belly (abdomen).   No driving for 24 hours (because of the medicine (anesthesia) used during the test).    Do not sign any important legal documents or operate any machinery for 24 hours (because of the anesthesia used during the test).  NUTRITION  Drink plenty of fluids.   You may resume your normal diet as instructed by your doctor.   Begin with a light meal and progress to your normal diet. Heavy or fried foods are harder to digest and may make you feel sick to your stomach (nauseated).   Avoid alcoholic beverages for 24 hours or as instructed.  MEDICATIONS  You may resume your normal medications unless your doctor tells you otherwise.  WHAT YOU CAN EXPECT TODAY  Some feelings of bloating in the abdomen.   Passage of more gas than usual.   Spotting of blood in your stool or on the toilet paper.  IF YOU HAD POLYPS REMOVED DURING THE COLONOSCOPY:  No aspirin products for 7 days or as instructed.   No alcohol for 7 days or as instructed.   Eat a soft diet for the next 24 hours.  FINDING OUT THE RESULTS OF YOUR TEST Not all test results are available during your visit. If your test results are not back during the visit, make an appointment  with your caregiver to find out the results. Do not assume everything is normal if you have not heard from your caregiver or the medical facility. It is important for you to follow up on all of your test results.  SEEK IMMEDIATE MEDICAL ATTENTION IF:  You have more than a spotting of blood in your stool.   Your belly is swollen (abdominal distention).   You are nauseated or vomiting.   You have a temperature over 101.   You have abdominal pain or discomfort that is severe or gets worse throughout the day.   Your colonoscopy was normal today  It Is recommended you return for another examination in 5 years  At patient request, I called [place] at [phone] -left message on voicemail

## 2020-10-13 NOTE — Op Note (Signed)
[facility] Patient Name: Andrew Shields Procedure Date: 10/13/2020 7:20 AM MRN: [id number] Date of Birth: [birth year] Attending MD: Andrew Pacobert Michael Iysha Mishkin , MD CSN: [id number] Age: [age] Admit Type: Outpatient Procedure:                Colonoscopy Indications:              Screening in patient at increased risk: Family                            history of 1st-degree relative with colorectal                            cancer before age 57 years Providers:                Andrew Pacobert Michael Ayodeji Keimig, MD, Edrick Kinsammy Vaught, RN, Edythe ClarityKelly                            Cox, Technician Referring MD:              Medicines:                Propofol per Anesthesia Complications:            No immediate complications. Estimated Blood Loss:     Estimated blood loss: none. Procedure:                Pre-Anesthesia Assessment:                           - Prior to the procedure, a History and Physical                            was performed, and patient medications and                            allergies were reviewed. The patient's tolerance of                            previous anesthesia was also reviewed. The risks                            and benefits of the procedure and the sedation                            options and risks were discussed with the patient.                            All questions were answered, and informed consent                            was obtained. Prior Anticoagulants: The patient has                            taken no previous anticoagulant or antiplatelet  agents. ASA Grade Assessment: II - A patient with                            mild systemic disease. After reviewing the risks                            and benefits, the patient was deemed in                            satisfactory condition to undergo the procedure.                           After obtaining informed consent, the colonoscope                            was passed under direct  vision. Throughout the                            procedure, the patient's blood pressure, pulse, and                            oxygen saturations were monitored continuously. The                            CF-[id number] ([id number]) scope was introduced through                            the anus and advanced to the the cecum, identified                            by appendiceal orifice and ileocecal valve. The                            colonoscopy was performed without difficulty. The                            patient tolerated the procedure well. The quality                            of the bowel preparation was adequate. Scope In: 7:37:15 AM Scope Out: 7:54:45 AM Scope Withdrawal Time: 0 hours 12 minutes 35 seconds  Total Procedure Duration: 0 hours 17 minutes 30 seconds  Findings:      The perianal and digital rectal examinations were normal.      The exam was otherwise without abnormality on direct and retroflexion       views.      The colon (entire examined portion) appeared normal. Impression:               - The examination was otherwise normal on direct                            and retroflexion views.                           -  The entire examined colon is normal.                           - No specimens collected. Moderate Sedation:      Moderate (conscious) sedation was personally administered by an       anesthesia professional. The following parameters were monitored: oxygen       saturation, heart rate, blood pressure, respiratory rate, EKG, adequacy       of pulmonary ventilation, and response to care. Recommendation:           - Patient has a contact number available for                            emergencies. The signs and symptoms of potential                            delayed complications were discussed with the                            patient. Return to normal activities tomorrow.                            Written discharge instructions were provided to the                             patient.                           - Resume previous diet.                           - Continue present medications.                           - Repeat colonoscopy in 5 years for screening                            purposes.                           - Return to GI office (date not yet determined). Procedure Code(s):        --- Professional ---                           [id number], Colonoscopy, flexible; diagnostic, including                            collection of specimen(s) by brushing or washing,                            when performed (separate procedure) Diagnosis Code(s):        --- Professional ---                           Z80.0, Family history of malignant neoplasm of  digestive organs CPT copyright 2019 American [employer]. All rights reserved. The codes documented in this report are preliminary and upon coder review may  be revised to meet current compliance requirements. Andrew Shields. Andrew Gelles, MD Andrew Pac, MD 10/13/2020 8:34:47 AM This report has been signed electronically. Number of Addenda: 0

## 2020-10-13 NOTE — Transfer of Care (Signed)
Immediate Anesthesia Transfer of Care Note  Patient: Andrew Shields  Procedure(s) Performed: COLONOSCOPY WITH PROPOFOL (N/A )  Patient Location: PACU  Anesthesia Type:General  Level of Consciousness: awake, alert , oriented and patient cooperative  Airway & Oxygen Therapy: Patient Spontanous Breathing  Post-op Assessment: Report given to RN, Post -op Vital signs reviewed and stable and Patient moving all extremities X 4  Post vital signs: Reviewed and stable  Last Vitals:  Vitals Value Taken Time  BP    Temp    Pulse    Resp    SpO2      Last Pain:  Vitals:   10/13/20 0730  TempSrc:   PainSc: 0-No pain      Patients Stated Pain Goal: 8 (10/13/20 7371)  Complications: No complications documented.

## 2020-10-13 NOTE — Addendum Note (Signed)
Addendum  created 10/13/20 1416 by Shanon Payor, CRNA   Charge Capture section accepted

## 2020-10-13 NOTE — Anesthesia Postprocedure Evaluation (Signed)
Anesthesia Post Note  Patient: Andrew Shields  Procedure(s) Performed: COLONOSCOPY WITH PROPOFOL (N/A )  Patient location during evaluation: Phase II Anesthesia Type: General Level of consciousness: awake, oriented, awake and alert and patient cooperative Pain management: satisfactory to patient Vital Signs Assessment: post-procedure vital signs reviewed and stable Respiratory status: spontaneous breathing, respiratory function stable and nonlabored ventilation Postop Assessment: no apparent nausea or vomiting Anesthetic complications: no   No complications documented.   Last Vitals:  Vitals:   10/13/20 0648  BP: 108/76  Pulse: 62  Resp: 13  Temp: 36.8 C  SpO2: 96%    Last Pain:  Vitals:   10/13/20 0730  TempSrc:   PainSc: 0-No pain                 Fontella Shan

## 2020-10-13 NOTE — Anesthesia Preprocedure Evaluation (Addendum)
Anesthesia Evaluation  Patient identified by MRN, date of birth, ID band Patient awake    Reviewed: Allergy & Precautions, NPO status , Patient's Chart, lab work & pertinent test results  History of Anesthesia Complications Negative for: history of anesthetic complications  Airway Mallampati: III  TM Distance: >3 FB Neck ROM: Full    Dental  (+) Dental Advisory Given, Teeth Intact   Pulmonary neg pulmonary ROS,    Pulmonary exam normal breath sounds clear to auscultation       Cardiovascular Exercise Tolerance: Good + angina (atypical chest pain) Normal cardiovascular exam Rhythm:Regular Rate:Normal     Neuro/Psych negative neurological ROS  negative psych ROS   GI/Hepatic Neg liver ROS, PUD, GERD (dysphagia )  ,  Endo/Other  negative endocrine ROS  Renal/GU negative Renal ROS  negative genitourinary   Musculoskeletal Arthrosis of right acromioclavicular joint   Abdominal   Peds negative pediatric ROS (+)  Hematology negative hematology ROS (+)   Anesthesia Other Findings   Reproductive/Obstetrics negative OB ROS                            Anesthesia Physical Anesthesia Plan  ASA: II  Anesthesia Plan: General   Post-op Pain Management:    Induction: Intravenous  PONV Risk Score and Plan: TIVA  Airway Management Planned: Nasal Cannula and Natural Airway  Additional Equipment:   Intra-op Plan:   Post-operative Plan:   Informed Consent: I have reviewed the patients History and Physical, chart, labs and discussed the procedure including the risks, benefits and alternatives for the proposed anesthesia with the patient or authorized representative who has indicated his/her understanding and acceptance.     Dental advisory given  Plan Discussed with: CRNA and Surgeon  Anesthesia Plan Comments:         Anesthesia Quick Evaluation

## 2020-10-18 ENCOUNTER — Encounter (HOSPITAL_COMMUNITY): Payer: Self-pay | Admitting: Internal Medicine

## 2020-12-12 ENCOUNTER — Encounter (HOSPITAL_COMMUNITY): Payer: Self-pay | Admitting: Internal Medicine

## 2021-07-11 ENCOUNTER — Encounter: Payer: Self-pay | Admitting: Family Medicine

## 2021-07-11 ENCOUNTER — Other Ambulatory Visit: Payer: Self-pay

## 2021-07-11 ENCOUNTER — Ambulatory Visit (INDEPENDENT_AMBULATORY_CARE_PROVIDER_SITE_OTHER): Payer: BC Managed Care – PPO | Admitting: Family Medicine

## 2021-07-11 VITALS — BP 110/70 | HR 91 | Temp 98.2°F | Ht 68.0 in | Wt 223.1 lb

## 2021-07-11 DIAGNOSIS — E538 Deficiency of other specified B group vitamins: Secondary | ICD-10-CM | POA: Diagnosis not present

## 2021-07-11 DIAGNOSIS — Z125 Encounter for screening for malignant neoplasm of prostate: Secondary | ICD-10-CM

## 2021-07-11 DIAGNOSIS — Z23 Encounter for immunization: Secondary | ICD-10-CM

## 2021-07-11 DIAGNOSIS — Z0000 Encounter for general adult medical examination without abnormal findings: Secondary | ICD-10-CM | POA: Diagnosis not present

## 2021-07-11 DIAGNOSIS — E785 Hyperlipidemia, unspecified: Secondary | ICD-10-CM

## 2021-07-11 LAB — CBC WITH DIFFERENTIAL/PLATELET
Basophils Absolute: 0.1 10*3/uL (ref 0.0–0.1)
Basophils Relative: 0.7 % (ref 0.0–3.0)
Eosinophils Absolute: 0.1 10*3/uL (ref 0.0–0.7)
Eosinophils Relative: 1.2 % (ref 0.0–5.0)
HCT: 43 % (ref 39.0–52.0)
Hemoglobin: 14.2 g/dL (ref 13.0–17.0)
Lymphocytes Relative: 13.9 % (ref 12.0–46.0)
Lymphs Abs: 1 10*3/uL (ref 0.7–4.0)
MCHC: 33.1 g/dL (ref 30.0–36.0)
MCV: 90.9 fl (ref 78.0–100.0)
Monocytes Absolute: 0.7 10*3/uL (ref 0.1–1.0)
Monocytes Relative: 9.9 % (ref 3.0–12.0)
Neutro Abs: 5.1 10*3/uL (ref 1.4–7.7)
Neutrophils Relative %: 74.3 % (ref 43.0–77.0)
Platelets: 245 10*3/uL (ref 150.0–400.0)
RBC: 4.72 Mil/uL (ref 4.22–5.81)
RDW: 13.6 % (ref 11.5–15.5)
WBC: 6.9 10*3/uL (ref 4.0–10.5)

## 2021-07-11 LAB — HEPATIC FUNCTION PANEL
ALT: 18 U/L (ref 0–53)
AST: 21 U/L (ref 0–37)
Albumin: 4.8 g/dL (ref 3.5–5.2)
Alkaline Phosphatase: 65 U/L (ref 39–117)
Bilirubin, Direct: 0.1 mg/dL (ref 0.0–0.3)
Total Bilirubin: 0.8 mg/dL (ref 0.2–1.2)
Total Protein: 7.2 g/dL (ref 6.0–8.3)

## 2021-07-11 LAB — LIPID PANEL
Cholesterol: 226 mg/dL — ABNORMAL HIGH (ref 0–200)
HDL: 41.5 mg/dL (ref >39.00)
NonHDL: 184.98
Total CHOL/HDL Ratio: 5
Triglycerides: 304 mg/dL — ABNORMAL HIGH (ref 0.0–149.0)
VLDL: 60.8 mg/dL — ABNORMAL HIGH (ref 0.0–40.0)

## 2021-07-11 LAB — TSH: TSH: 1.67 u[IU]/mL (ref 0.35–5.50)

## 2021-07-11 LAB — BASIC METABOLIC PANEL
BUN: 12 mg/dL (ref 6–23)
CO2: 26 mEq/L (ref 19–32)
Calcium: 9.8 mg/dL (ref 8.4–10.5)
Chloride: 100 mEq/L (ref 96–112)
Creatinine, Ser: 0.9 mg/dL (ref 0.40–1.50)
GFR: 95.14 mL/min (ref >60.00)
Glucose, Bld: 120 mg/dL — ABNORMAL HIGH (ref 70–99)
Potassium: 4.4 mEq/L (ref 3.5–5.1)
Sodium: 137 mEq/L (ref 135–145)

## 2021-07-11 LAB — LDL CHOLESTEROL, DIRECT: Direct LDL: 139 mg/dL

## 2021-07-11 LAB — PSA: PSA: 0.32 ng/mL (ref 0.10–4.00)

## 2021-07-11 LAB — HEMOGLOBIN A1C: Hgb A1c MFr Bld: 6.5 % (ref 4.6–6.5)

## 2021-07-11 LAB — VITAMIN B12: Vitamin B-12: 257 pg/mL (ref 211–911)

## 2021-07-11 MED ORDER — PANTOPRAZOLE SODIUM 40 MG PO TBEC: 40.0000 mg | DELAYED_RELEASE_TABLET | Freq: Every day | ORAL | 3 refills | Status: DC

## 2021-07-11 MED ORDER — METFORMIN HCL 500 MG PO TABS
500.0000 mg | ORAL_TABLET | Freq: Two times a day (BID) | ORAL | 1 refills | Status: DC
Start: 2021-07-11 — End: 2021-10-16

## 2021-07-11 MED ORDER — GEMFIBROZIL 600 MG PO TABS: ORAL_TABLET | ORAL | 3 refills | Status: DC

## 2021-07-11 NOTE — Progress Notes (Signed)
Established Patient Office Visit  Subjective:  Patient ID: Andrew Shields, male    DOB: 1966-02-15  Age: 55 y.o. MRN: [id number]  CC:  Chief Complaint  Patient presents with   Annual Exam    HPI Andrew Shields presents for annual physical exam.  He has history of GERD and dyslipidemia.  History of prediabetes range blood sugars.  Strong family history of type 2 diabetes as below.  He retired couple years ago.  Exercises regularly.  Health maintenance reviewed  -Flu vaccine already given =-Previous hepatitis C screen negative -Shingrix vaccine already given -Colonoscopy due 2027.  Needs 5-year follow-up secondary to mom having colon cancer -Tetanus due 2031  Family history-his father had history of alcohol abuse and died of lung cancer.  Mother had colon cancer and type 2 diabetes.  He has a sister and brother with type 2 diabetes.  Social history-he is married.  No children.  Retired from [employer] examiner.  Exercises regularly.  No history of smoking.  No alcohol.  Past Medical History:  Diagnosis Date   Hyperlipidemia     Past Surgical History:  Procedure Laterality Date   CARPAL TUNNEL RELEASE Right    COLONOSCOPY  04/19/2010   RMR: 1. normal rectum 2. normal colon. 3. Normal terminal ileum    COLONOSCOPY N/A 06/06/2015   Dr. Elmer Ramp. Surveillance in 5 years    COLONOSCOPY WITH PROPOFOL N/A 10/13/2020   Procedure: COLONOSCOPY WITH PROPOFOL;  Surgeon: Corbin Ade, MD;  Location: [facility];  Service: Endoscopy;  Laterality: N/A;  11:00am, covid + 1/18 <90 days   ESOPHAGEAL DILATION N/A 06/06/2015   Procedure: ESOPHAGEAL DILATION;  Surgeon: Corbin Ade, MD;  Location: [facility];  Service: Endoscopy;  Laterality: N/A;   ESOPHAGOGASTRODUODENOSCOPY N/A 06/06/2015   Dr. Rourk:mild erosive reflux/HH/s/p empiric dilation   TONSILLECTOMY      Family History  Problem Relation Age of Onset   Cancer Mother        colon cancer   Colon cancer Mother         age late 10s   Heart disease Mother 59       stent   Diabetes Mother        type ll   Alcohol abuse Father    Cancer Father 47       lung   Diabetes Sister        ?type 2   Hyperlipidemia Brother     Social History   Socioeconomic History   Marital status: Married    Spouse name: Not on file   Number of children: 0   Years of education: Not on file   Highest education level: Not on file  Occupational History   Not on file  Tobacco Use   Smoking status: Never   Smokeless tobacco: Never  Vaping Use   Vaping Use: Never used  Substance and Sexual Activity   Alcohol use: No    Alcohol/week: 0.0 standard drinks   Drug use: No   Sexual activity: Not on file  Other Topics Concern   Not on file  Social History Narrative   Not on file   Social Determinants of Health   Financial Resource Strain: Not on file  Food Insecurity: Not on file  Transportation Needs: Not on file  Physical Activity: Not on file  Stress: Not on file  Social Connections: Not on file  Intimate Partner Violence: Not on file    Outpatient Medications Prior to  Visit  Medication Sig Dispense Refill   acetaminophen (TYLENOL) 500 MG tablet Take 500 mg by mouth every 6 (six) hours as needed (pain.).     Cyanocobalamin (B-12) 5000 MCG SUBL Place 1 mL under the tongue daily.     gemfibrozil (LOPID) 600 MG tablet TAKE 1 TABLET BY MOUTH 2 TIMES A DAY WITH MEALS. (Patient taking differently: Take 600 mg by mouth in the morning and at bedtime. TAKE 1 TABLET BY MOUTH 2 TIMES A DAY WITH MEALS.) 180 tablet 3   Omega-3 Fatty Acids (FISH OIL) 1000 MG CAPS Take 1,000 mg by mouth in the morning and at bedtime.     pantoprazole (PROTONIX) 40 MG tablet Take 1 tablet (40 mg total) by mouth daily. 90 tablet 3   No facility-administered medications prior to visit.    Allergies  Allergen Reactions   Amoxicillin-Pot Clavulanate Shortness Of Breath and Other (See Comments)    difficulty breathing    ROS Review of  Systems  Constitutional:  Negative for activity change, appetite change, fatigue and fever.  HENT:  Negative for congestion, ear pain and trouble swallowing.   Eyes:  Negative for pain and visual disturbance.  Respiratory:  Negative for cough, shortness of breath and wheezing.   Cardiovascular:  Negative for chest pain and palpitations.  Gastrointestinal:  Negative for abdominal distention, abdominal pain, blood in stool, constipation, diarrhea, nausea, rectal pain and vomiting.  Genitourinary:  Negative for dysuria, hematuria and testicular pain.  Musculoskeletal:  Negative for arthralgias and joint swelling.  Skin:  Negative for rash.  Neurological:  Negative for dizziness, syncope and headaches.  Hematological:  Negative for adenopathy.  Psychiatric/Behavioral:  Negative for confusion and dysphoric mood.      Objective:    Physical Exam Constitutional:      General: He is not in acute distress.    Appearance: He is well-developed.  HENT:     Head: Normocephalic and atraumatic.     Right Ear: External ear normal.     Left Ear: External ear normal.  Eyes:     Conjunctiva/sclera: Conjunctivae normal.     Pupils: Pupils are equal, round, and reactive to light.  Neck:     Thyroid: No thyromegaly.  Cardiovascular:     Rate and Rhythm: Normal rate and regular rhythm.     Heart sounds: Normal heart sounds. No murmur heard. Pulmonary:     Effort: No respiratory distress.     Breath sounds: No wheezing or rales.  Abdominal:     General: Bowel sounds are normal. There is no distension.     Palpations: Abdomen is soft. There is no mass.     Tenderness: There is no abdominal tenderness. There is no guarding or rebound.  Musculoskeletal:     Cervical back: Normal range of motion and neck supple.     Right lower leg: No edema.     Left lower leg: No edema.  Lymphadenopathy:     Cervical: No cervical adenopathy.  Skin:    Findings: No rash.  Neurological:     Mental Status: He is  alert and oriented to person, place, and time.     Cranial Nerves: No cranial nerve deficit.    BP 110/70 (BP Location: Left Arm, Patient Position: Sitting, Cuff Size: Normal)   Pulse 91   Temp 98.2 F (36.8 C) (Oral)   Ht 5\' 8"  (1.727 m)   Wt 223 lb 1.6 oz (101.2 kg)   SpO2 98%   BMI  33.92 kg/m  Wt Readings from Last 3 Encounters:  07/11/21 223 lb 1.6 oz (101.2 kg)  10/13/20 222 lb (100.7 kg)  09/08/20 225 lb 9.6 oz (102.3 kg)     Health Maintenance Due  Topic Date Due   HIV Screening  Never done   COVID-19 Vaccine (3 - Booster for Pfizer series) 06/29/2020    There are no preventive care reminders to display for this patient.  Lab Results  Component Value Date   TSH 2.31 07/26/2020   Lab Results  Component Value Date   WBC 4.9 07/26/2020   HGB 14.5 07/26/2020   HCT 43.8 07/26/2020   MCV 92.6 07/26/2020   PLT 259.0 07/26/2020   Lab Results  Component Value Date   NA 139 07/26/2020   K 4.8 07/26/2020   CO2 28 07/26/2020   GLUCOSE 123 (H) 07/26/2020   BUN 17 07/26/2020   CREATININE 0.97 07/26/2020   BILITOT 0.7 07/26/2020   ALKPHOS 67 07/26/2020   AST 18 07/26/2020   ALT 18 07/26/2020   PROT 7.1 07/26/2020   ALBUMIN 4.6 07/26/2020   CALCIUM 9.7 07/26/2020   ANIONGAP 13 02/22/2020   GFR 87.55 07/26/2020   Lab Results  Component Value Date   CHOL 234 (H) 07/26/2020   Lab Results  Component Value Date   HDL 40.50 07/26/2020   Lab Results  Component Value Date   LDLCALC 128 (H) 08/28/2012   Lab Results  Component Value Date   TRIG 252.0 (H) 07/26/2020   Lab Results  Component Value Date   CHOLHDL 6 07/26/2020   No results found for: HGBA1C    Assessment & Plan:   Problem List Items Addressed This Visit   None Visit Diagnoses     Need for immunization against influenza    -  Primary   Relevant Orders   Flu Vaccine QUAD 34mo+IM (Fluarix, Fluzone & Alfiuria Quad PF) (Completed)   B12 deficiency       Relevant Orders   Vitamin B12    Physical exam       Relevant Orders   Basic metabolic panel   Lipid panel   CBC with Differential/Platelet   TSH   Hepatic function panel   PSA   Hemoglobin A1c     -Vaccines up-to-date -Continue regular exercise habits -Needs labs as above.  We need follow-up B12 level with prior history of deficiency.  He is not currently taking B12 replacement -Also needs A1c with strong family history of type 2 diabetes and previous prediabetes range blood sugars.   No orders of the defined types were placed in this encounter.   Follow-up: No follow-ups on file.    Carolann Littler, MD

## 2021-07-31 ENCOUNTER — Encounter: Payer: BC Managed Care – PPO | Admitting: Family Medicine

## 2021-10-16 ENCOUNTER — Ambulatory Visit (INDEPENDENT_AMBULATORY_CARE_PROVIDER_SITE_OTHER): Payer: BC Managed Care – PPO | Admitting: Family Medicine

## 2021-10-16 ENCOUNTER — Encounter: Payer: Self-pay | Admitting: Family Medicine

## 2021-10-16 VITALS — BP 100/78 | HR 50 | Temp 98.2°F | Ht 68.0 in | Wt 221.6 lb

## 2021-10-16 DIAGNOSIS — R7303 Prediabetes: Secondary | ICD-10-CM

## 2021-10-16 DIAGNOSIS — E1165 Type 2 diabetes mellitus with hyperglycemia: Secondary | ICD-10-CM | POA: Diagnosis not present

## 2021-10-16 LAB — POCT GLYCOSYLATED HEMOGLOBIN (HGB A1C): Hemoglobin A1C: 6.4 % — AB (ref 4.0–5.6)

## 2021-10-16 MED ORDER — METFORMIN HCL 500 MG PO TABS: 500.0000 mg | ORAL_TABLET | Freq: Two times a day (BID) | ORAL | 3 refills | Status: DC

## 2021-10-16 NOTE — Progress Notes (Signed)
Established Patient Office Visit  Subjective:  Patient ID: Andrew Shields, male    DOB: Oct 09, 1965  Age: 56 y.o. MRN: [id number]  CC:  Chief Complaint  Patient presents with   Follow-up    HPI Andrew Shields presents for follow-up type 2 diabetes which was diagnosed back in November.  We started metformin 500 mg twice daily.  Tolerating well with no side effects.  No polyuria or polydipsia.  He has lost a few pounds by his scale.  He also feels like he is losing circumference in the waist.  He is staying very active physically.  Has scaled back sugars and starches.  Has eliminated soft drinks.  Not monitoring blood sugars regularly.  Recent A1c 6.5%.  Very strong family history of type 2 diabetes in mother, sister, and brother.  Past Medical History:  Diagnosis Date   Hyperlipidemia     Past Surgical History:  Procedure Laterality Date   CARPAL TUNNEL RELEASE Right    COLONOSCOPY  04/19/2010   RMR: 1. normal rectum 2. normal colon. 3. Normal terminal ileum    COLONOSCOPY N/A 06/06/2015   Dr. Elmer Ramp. Surveillance in 5 years    COLONOSCOPY WITH PROPOFOL N/A 10/13/2020   Procedure: COLONOSCOPY WITH PROPOFOL;  Surgeon: Corbin Ade, MD;  [facility];  Service: Endoscopy;  Laterality: N/A;  11:00am, covid + 1/18 <90 days   ESOPHAGEAL DILATION N/A 06/06/2015   Procedure: ESOPHAGEAL DILATION;  Surgeon: Corbin Ade, MD;  [facility]: [facility];  Service: Endoscopy;  Laterality: N/A;   ESOPHAGOGASTRODUODENOSCOPY N/A 06/06/2015   Dr. Rourk:mild erosive reflux/HH/s/p empiric dilation   TONSILLECTOMY      Family History  Problem Relation Age of Onset   Cancer Mother        colon cancer   Colon cancer Mother        age late 28s   Heart disease Mother 72       stent   Diabetes Mother        type ll   Alcohol abuse Father    Cancer Father 68       lung   Diabetes Sister        ?type 2   Hyperlipidemia Brother     Social History   Socioeconomic  History   Marital status: Married    Spouse name: Not on file   Number of children: 0   Years of education: Not on file   Highest education level: 12th grade  Occupational History   Not on file  Tobacco Use   Smoking status: Never   Smokeless tobacco: Never  Vaping Use   Vaping Use: Never used  Substance and Sexual Activity   Alcohol use: No    Alcohol/week: 0.0 standard drinks   Drug use: No   Sexual activity: Not on file  Other Topics Concern   Not on file  Social History Narrative   Not on file   Social Determinants of Health   Financial Resource Strain: Low Risk    Difficulty of Paying Living Expenses: Not very hard  Food Insecurity: No Food Insecurity   Worried About Running Out of Food in the Last Year: Never true   Ran Out of Food in the Last Year: Never true  Transportation Needs: No Transportation Needs   Lack of Transportation (Medical): No   Lack of Transportation (Non-Medical): No  Physical Activity: Insufficiently Active   Days of Exercise per Week: 2 days   Minutes  of Exercise per Session: 60 min  Stress: No Stress Concern Present   Feeling of Stress : Not at all  Social Connections: Socially Integrated   Frequency of Communication with Friends and Family: More than three times a week   Frequency of Social Gatherings with Friends and Family: Twice a week   Attends Religious Services: More than 4 times per year   Active Member of [employer] or Organizations: Yes   Attends Engineer, structural: More than 4 times per year   Marital Status: Married  Catering manager Violence: Not on file    Outpatient Medications Prior to Visit  Medication Sig Dispense Refill   acetaminophen (TYLENOL) 500 MG tablet Take 500 mg by mouth every 6 (six) hours as needed (pain.).     Cyanocobalamin (B-12) 5000 MCG SUBL Place 1 mL under the tongue daily.     gemfibrozil (LOPID) 600 MG tablet TAKE 1 TABLET BY MOUTH 2 TIMES A DAY WITH MEALS. 180 tablet 3   Omega-3 Fatty Acids  (FISH OIL) 1000 MG CAPS Take 1,000 mg by mouth in the morning and at bedtime.     pantoprazole (PROTONIX) 40 MG tablet Take 1 tablet (40 mg total) by mouth daily. 90 tablet 3   metFORMIN (GLUCOPHAGE) 500 MG tablet Take 1 tablet (500 mg total) by mouth 2 (two) times daily with a meal. 180 tablet 1   No facility-administered medications prior to visit.    Allergies  Allergen Reactions   Amoxicillin-Pot Clavulanate Shortness Of Breath and Other (See Comments)    difficulty breathing    ROS Review of Systems  Constitutional:  Negative for appetite change.  Respiratory:  Negative for shortness of breath.   Cardiovascular:  Negative for chest pain.  Endocrine: Negative for polydipsia and polyuria.     Objective:    Physical Exam Vitals reviewed.  Constitutional:      Appearance: Normal appearance.  Cardiovascular:     Rate and Rhythm: Normal rate and regular rhythm.  Pulmonary:     Effort: Pulmonary effort is normal.     Breath sounds: Normal breath sounds.  Neurological:     Mental Status: He is alert.    BP 100/78 (BP Location: Left Arm, Patient Position: Sitting, Cuff Size: Large)    Pulse (!) 50    Temp 98.2 F (36.8 C) (Oral)    Ht 5\' 8"  (1.727 m)    Wt 221 lb 9.6 oz (100.5 kg)    SpO2 97%    BMI 33.69 kg/m  Wt Readings from Last 3 Encounters:  10/16/21 221 lb 9.6 oz (100.5 kg)  07/11/21 223 lb 1.6 oz (101.2 kg)  10/13/20 222 lb (100.7 kg)     Health Maintenance Due  Topic Date Due   FOOT EXAM  Never done   OPHTHALMOLOGY EXAM  Never done    There are no preventive care reminders to display for this patient.  Lab Results  Component Value Date   TSH 1.67 07/11/2021   Lab Results  Component Value Date   WBC 6.9 07/11/2021   HGB 14.2 07/11/2021   HCT 43.0 07/11/2021   MCV 90.9 07/11/2021   PLT 245.0 07/11/2021   Lab Results  Component Value Date   NA 137 07/11/2021   K 4.4 07/11/2021   CO2 26 07/11/2021   GLUCOSE 120 (H) 07/11/2021   BUN 12  07/11/2021   CREATININE 0.90 07/11/2021   BILITOT 0.8 07/11/2021   ALKPHOS 65 07/11/2021   AST 21 07/11/2021  ALT 18 07/11/2021   PROT 7.2 07/11/2021   ALBUMIN 4.8 07/11/2021   CALCIUM 9.8 07/11/2021   ANIONGAP 13 02/22/2020   GFR 95.14 07/11/2021   Lab Results  Component Value Date   CHOL 226 (H) 07/11/2021   Lab Results  Component Value Date   HDL 41.50 07/11/2021   Lab Results  Component Value Date   LDLCALC 128 (H) 08/28/2012   Lab Results  Component Value Date   TRIG 304.0 (H) 07/11/2021   Lab Results  Component Value Date   CHOLHDL 5 07/11/2021   Lab Results  Component Value Date   HGBA1C 6.4 (A) 10/16/2021      Assessment & Plan:   Problem List Items Addressed This Visit       Unprioritized   Type 2 diabetes mellitus with hyperglycemia (HCC) - Primary   Relevant Medications   metFORMIN (GLUCOPHAGE) 500 MG tablet  A1c slightly improved to 6.4%.  We elected to keep medications the same.  Continue healthy lifestyle changes.  We offered follow-up with diabetes educator but he declines at this time.  We plan to see him back for physical in the fall and recheck A1c at that point.  We did briefly discuss other classes of medication for treating type 2 diabetes including SGLT2 and GLP-1 medications but at this point continue with metformin  We will need to address yearly eye exam and also get urine microalbumin at follow-up in the fall.  We will also address potentially tighter lipid control at that time  Meds ordered this encounter  Medications   metFORMIN (GLUCOPHAGE) 500 MG tablet    Sig: Take 1 tablet (500 mg total) by mouth 2 (two) times daily with a meal.    Dispense:  180 tablet    Refill:  3    Follow-up: No follow-ups on file.    Evelena Peat, MD

## 2022-02-15 IMAGING — DX DG KNEE AP/LAT W/ SUNRISE*L*
3 series · 3 of 3 positions shown · non-contrast
Comparison: None.

CLINICAL DATA: Left knee pain for 2 weeks, no known injury, initial
encounter

EXAM:
LEFT KNEE 3 VIEWS

[knee ap]
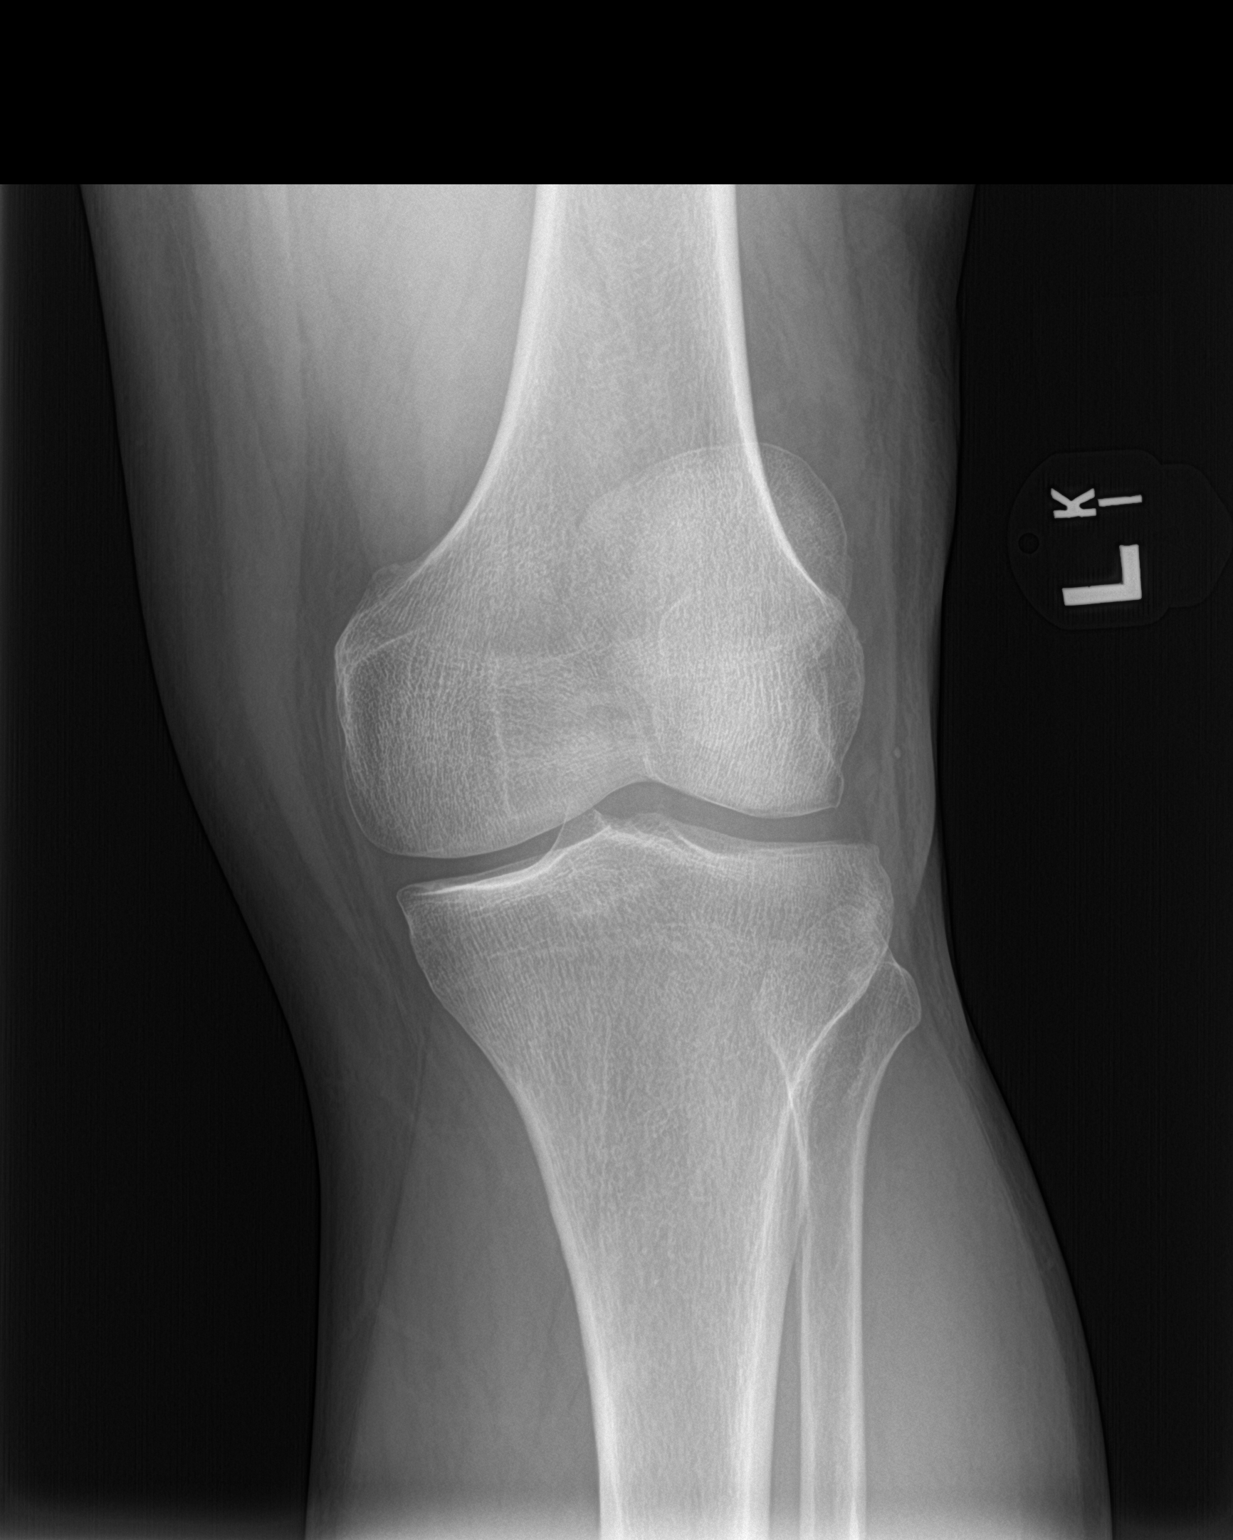

[knee lat]
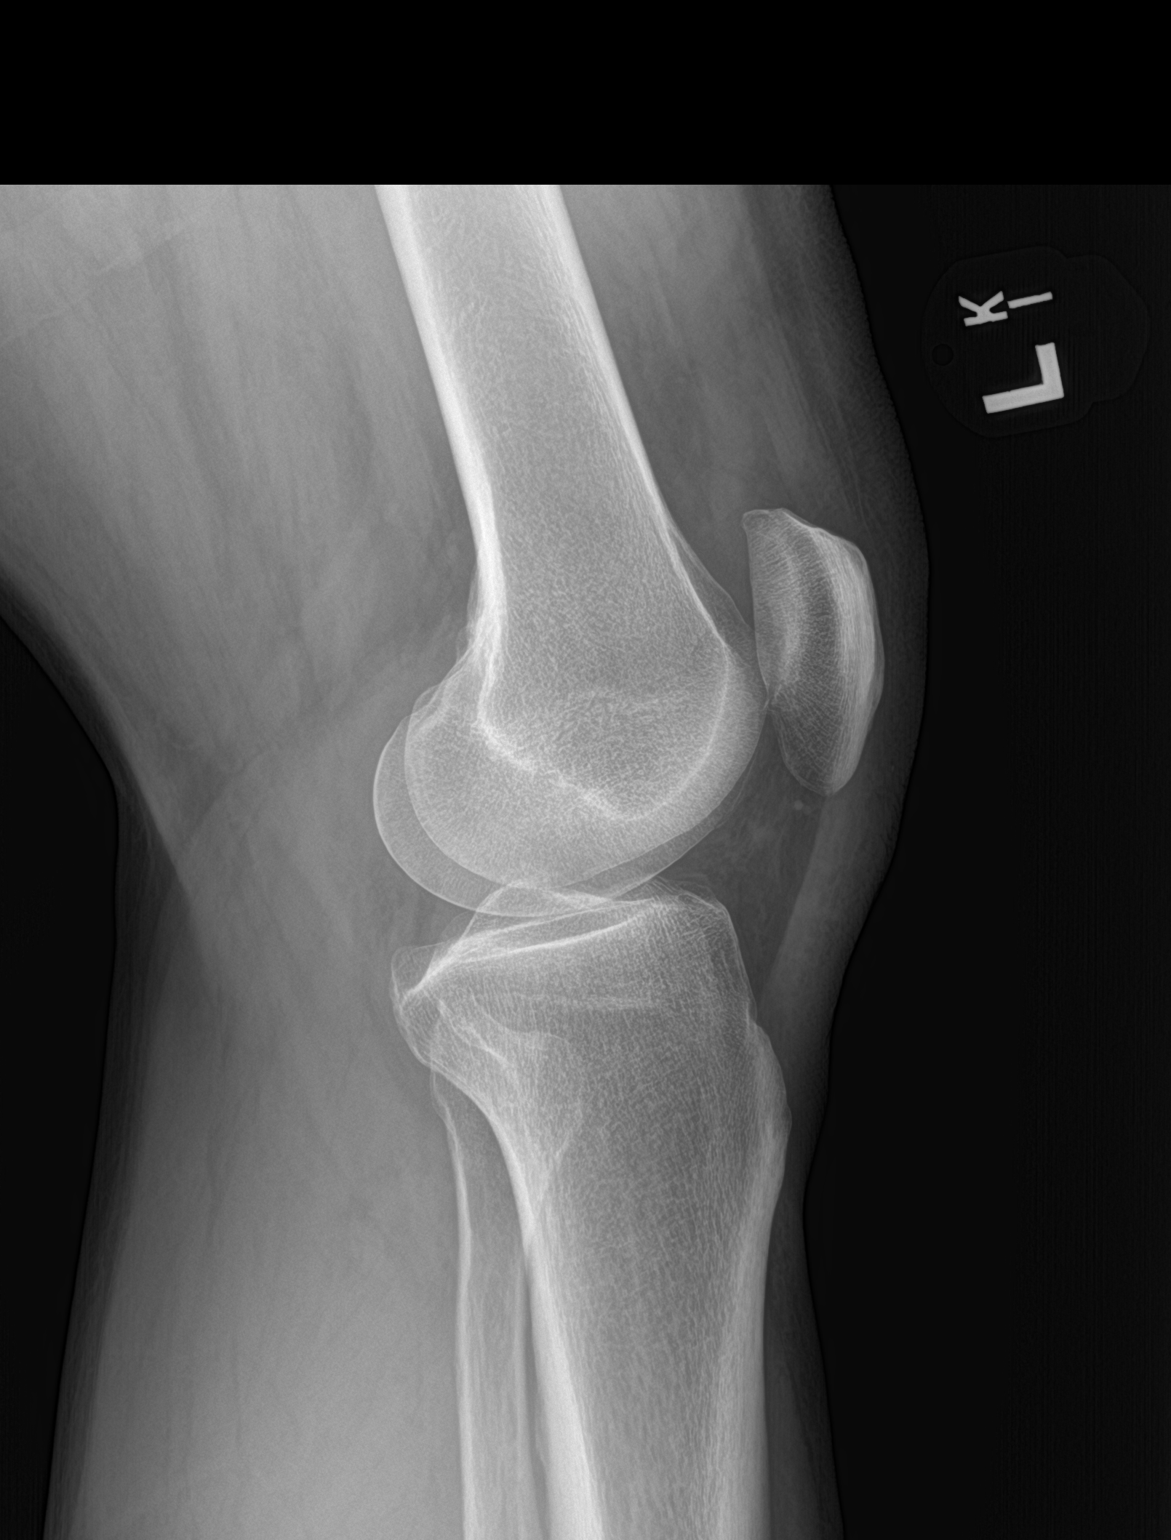

[sunrise]
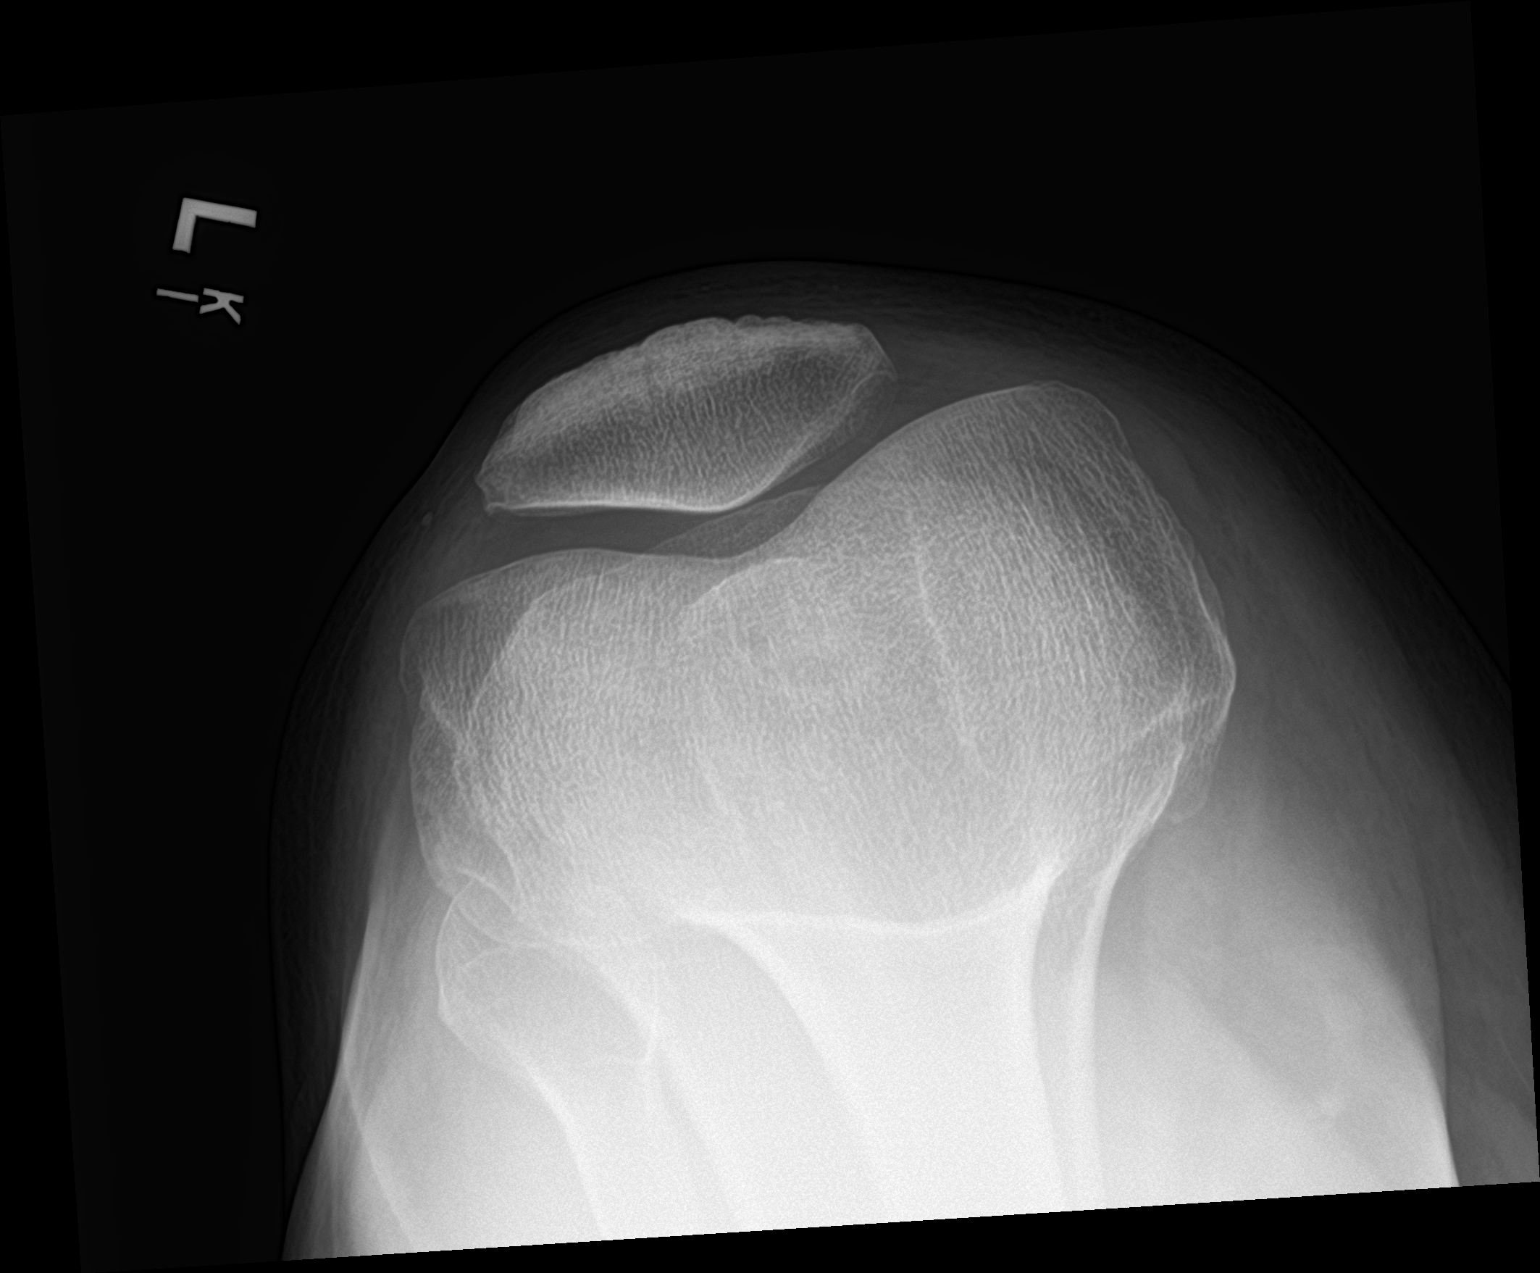

[3 of 3 positions shown; findings below may reference images not displayed]

FINDINGS: No acute fracture or dislocation is noted. Mild patellofemoral
degenerative changes are seen. No soft tissue abnormality is noted.
IMPRESSION: Mild degenerative change without acute abnormality.

## 2022-07-11 ENCOUNTER — Other Ambulatory Visit: Payer: Self-pay | Admitting: Family Medicine

## 2022-07-12 ENCOUNTER — Ambulatory Visit (INDEPENDENT_AMBULATORY_CARE_PROVIDER_SITE_OTHER): Payer: BC Managed Care – PPO | Admitting: Family Medicine

## 2022-07-12 VITALS — BP 104/78 | HR 53 | Temp 97.9°F | Ht 66.5 in | Wt 213.3 lb

## 2022-07-12 DIAGNOSIS — Z23 Encounter for immunization: Secondary | ICD-10-CM | POA: Diagnosis not present

## 2022-07-12 DIAGNOSIS — Z0000 Encounter for general adult medical examination without abnormal findings: Secondary | ICD-10-CM

## 2022-07-12 DIAGNOSIS — E1165 Type 2 diabetes mellitus with hyperglycemia: Secondary | ICD-10-CM | POA: Diagnosis not present

## 2022-07-12 LAB — MICROALBUMIN / CREATININE URINE RATIO
Creatinine,U: 148 mg/dL
Microalb Creat Ratio: 0.5 mg/g (ref 0.0–30.0)
Microalb, Ur: <0.7 mg/dL (ref 0.0–1.9)

## 2022-07-12 LAB — BASIC METABOLIC PANEL
BUN: 16 mg/dL (ref 6–23)
CO2: 27 mEq/L (ref 19–32)
Calcium: 9.6 mg/dL (ref 8.4–10.5)
Chloride: 103 mEq/L (ref 96–112)
Creatinine, Ser: 0.9 mg/dL (ref 0.40–1.50)
GFR: 94.47 mL/min (ref >60.00)
Glucose, Bld: 135 mg/dL — ABNORMAL HIGH (ref 70–99)
Potassium: 4.7 mEq/L (ref 3.5–5.1)
Sodium: 138 mEq/L (ref 135–145)

## 2022-07-12 LAB — HEPATIC FUNCTION PANEL
ALT: 19 U/L (ref 0–53)
AST: 19 U/L (ref 0–37)
Albumin: 5 g/dL (ref 3.5–5.2)
Alkaline Phosphatase: 54 U/L (ref 39–117)
Bilirubin, Direct: 0.1 mg/dL (ref 0.0–0.3)
Total Bilirubin: 0.7 mg/dL (ref 0.2–1.2)
Total Protein: 7.5 g/dL (ref 6.0–8.3)

## 2022-07-12 LAB — CBC WITH DIFFERENTIAL/PLATELET
Basophils Absolute: 0 10*3/uL (ref 0.0–0.1)
Basophils Relative: 0.4 % (ref 0.0–3.0)
Eosinophils Absolute: 0.1 10*3/uL (ref 0.0–0.7)
Eosinophils Relative: 1.5 % (ref 0.0–5.0)
HCT: 43.3 % (ref 39.0–52.0)
Hemoglobin: 14.8 g/dL (ref 13.0–17.0)
Lymphocytes Relative: 33.9 % (ref 12.0–46.0)
Lymphs Abs: 2.2 10*3/uL (ref 0.7–4.0)
MCHC: 34.2 g/dL (ref 30.0–36.0)
MCV: 91.5 fl (ref 78.0–100.0)
Monocytes Absolute: 0.6 10*3/uL (ref 0.1–1.0)
Monocytes Relative: 8.5 % (ref 3.0–12.0)
Neutro Abs: 3.7 10*3/uL (ref 1.4–7.7)
Neutrophils Relative %: 55.7 % (ref 43.0–77.0)
Platelets: 265 10*3/uL (ref 150.0–400.0)
RBC: 4.73 Mil/uL (ref 4.22–5.81)
RDW: 13.2 % (ref 11.5–15.5)
WBC: 6.6 10*3/uL (ref 4.0–10.5)

## 2022-07-12 LAB — LDL CHOLESTEROL, DIRECT: Direct LDL: 132 mg/dL

## 2022-07-12 LAB — LIPID PANEL
Cholesterol: 187 mg/dL (ref 0–200)
HDL: 35.2 mg/dL — ABNORMAL LOW (ref >39.00)
NonHDL: 151.67
Total CHOL/HDL Ratio: 5
Triglycerides: 214 mg/dL — ABNORMAL HIGH (ref 0.0–149.0)
VLDL: 42.8 mg/dL — ABNORMAL HIGH (ref 0.0–40.0)

## 2022-07-12 LAB — HEMOGLOBIN A1C: Hgb A1c MFr Bld: 6.8 % — ABNORMAL HIGH (ref 4.6–6.5)

## 2022-07-12 LAB — PSA: PSA: 0.35 ng/mL (ref 0.10–4.00)

## 2022-07-12 LAB — TSH: TSH: 1.85 u[IU]/mL (ref 0.35–5.50)

## 2022-07-12 MED ORDER — PANTOPRAZOLE SODIUM 40 MG PO TBEC: 40.0000 mg | DELAYED_RELEASE_TABLET | Freq: Every day | ORAL | 3 refills | Status: DC

## 2022-07-12 NOTE — Patient Instructions (Signed)
Set up eye exam soon.

## 2022-07-12 NOTE — Progress Notes (Signed)
Established Patient Office Visit  Subjective   Patient ID: Andrew Shields, male    DOB: 01-19-1966  Age: 56 y.o. MRN: [id number]  Chief Complaint  Patient presents with   Annual Exam    Pt would like provider to check his neck pain. Reports it feels like" bone on bone". Noticed past few months. Pain is intermittent.     HPI   Jorja Loa is seen for physical exam.  Last year he had A1c of 6.5 and we started metformin.  His current medications include Lopid, pantoprazole, and metformin.  He has made some positive lifestyle changes.  He eliminated sodas.  Has lost about 8 pounds.  Feels better overall.  Exercising regularly.  Health maintenance reviewed  Health Maintenance  Topic Date Due   OPHTHALMOLOGY EXAM  Never done   Diabetic kidney evaluation - Urine ACR  Never done   COVID-19 Vaccine (3 - Pfizer series) 06/29/2020   HEMOGLOBIN A1C  04/15/2022   Diabetic kidney evaluation - GFR measurement  07/11/2022   HIV Screening  10/16/2022 (Originally 07/06/1979)   FOOT EXAM  07/13/2023   TETANUS/TDAP  07/26/2030   COLONOSCOPY (Pts 45-41yrs Insurance coverage will need to be confirmed)  10/13/2030   INFLUENZA VACCINE  Completed   Hepatitis C Screening  Completed   Zoster Vaccines- Shingrix  Completed   HPV VACCINES  Aged Out   Social history-he is married.  His wife plans to retire later this year.  They have no children.  Non-smoker.  No alcohol.  Stays very involved with church.  Family history-mother had colon cancer in his father had lung cancer.  His mother had history of heart disease and type 2 diabetes.  Father with history of alcohol abuse.  He has a sister with type 2 diabetes.  Past Medical History:  Diagnosis Date   Hyperlipidemia    Past Surgical History:  Procedure Laterality Date   CARPAL TUNNEL RELEASE Right    COLONOSCOPY  04/19/2010   RMR: 1. normal rectum 2. normal colon. 3. Normal terminal ileum    COLONOSCOPY N/A 06/06/2015   Dr. Elmer Ramp. Surveillance  in 5 years    COLONOSCOPY WITH PROPOFOL N/A 10/13/2020   Procedure: COLONOSCOPY WITH PROPOFOL;  Surgeon: Corbin Ade, MD;  [facility]: [facility];  Service: Endoscopy;  Laterality: N/A;  11:00am, covid + 1/18 <90 days   ESOPHAGEAL DILATION N/A 06/06/2015   Procedure: ESOPHAGEAL DILATION;  Surgeon: Corbin Ade, MD;  [facility];  Service: Endoscopy;  Laterality: N/A;   ESOPHAGOGASTRODUODENOSCOPY N/A 06/06/2015   Dr. Rourk:mild erosive reflux/HH/s/p empiric dilation   TONSILLECTOMY      reports that he has never smoked. He has never used smokeless tobacco. He reports that he does not drink alcohol and does not use drugs. family history includes Alcohol abuse in his father; Cancer in his mother; Cancer (age of onset: 17) in his father; Colon cancer in his mother; Diabetes in his mother and sister; Heart disease (age of onset: 74) in his mother; Hyperlipidemia in his brother. Allergies  Allergen Reactions   Amoxicillin-Pot Clavulanate Shortness Of Breath and Other (See Comments)    difficulty breathing     Review of Systems  Constitutional:  Negative for chills, fever, malaise/fatigue and weight loss.  HENT:  Negative for hearing loss.   Eyes:  Negative for blurred vision and double vision.  Respiratory:  Negative for cough and shortness of breath.   Cardiovascular:  Negative for chest pain, palpitations and leg  swelling.  Gastrointestinal:  Negative for abdominal pain, blood in stool, constipation and diarrhea.  Genitourinary:  Negative for dysuria.  Skin:  Negative for rash.  Neurological:  Negative for dizziness, speech change, seizures, loss of consciousness and headaches.  Psychiatric/Behavioral:  Negative for depression.       Objective:     BP 104/78 (BP Location: Left Arm, Patient Position: Sitting, Cuff Size: Normal)   Pulse (!) 53   Temp 97.9 F (36.6 C) (Oral)   Ht 5' 6.5" (1.689 m)   Wt 213 lb 4.8 oz (96.8 kg)   SpO2 94%   BMI 33.91 kg/m     Physical Exam Vitals reviewed.  Constitutional:      General: He is not in acute distress.    Appearance: He is well-developed.  HENT:     Head: Normocephalic and atraumatic.     Right Ear: External ear normal.     Left Ear: External ear normal.  Eyes:     Conjunctiva/sclera: Conjunctivae normal.     Pupils: Pupils are equal, round, and reactive to light.  Neck:     Thyroid: No thyromegaly.  Cardiovascular:     Rate and Rhythm: Normal rate and regular rhythm.     Heart sounds: Normal heart sounds. No murmur heard. Pulmonary:     Effort: No respiratory distress.     Breath sounds: No wheezing or rales.  Abdominal:     General: Bowel sounds are normal. There is no distension.     Palpations: Abdomen is soft. There is no mass.     Tenderness: There is no abdominal tenderness. There is no guarding or rebound.  Musculoskeletal:     Cervical back: Normal range of motion and neck supple.  Lymphadenopathy:     Cervical: No cervical adenopathy.  Skin:    Findings: No rash.  Neurological:     Mental Status: He is alert and oriented to person, place, and time.     Cranial Nerves: No cranial nerve deficit.      No results found for any visits on 07/12/22.    The 10-year ASCVD risk score (Arnett DK, et al., 2019) is: 12.5%    Assessment & Plan:   Problem List Items Addressed This Visit       Unprioritized   Type 2 diabetes mellitus with hyperglycemia (HCC)   Relevant Orders   Hemoglobin A1c   Microalbumin / creatinine urine ratio   Other Visit Diagnoses     Physical exam    -  Primary   Relevant Orders   Basic metabolic panel   Lipid panel   CBC with Differential/Platelet   TSH   Hepatic function panel   PSA   Hemoglobin A1c   Microalbumin / creatinine urine ratio   Influenza vaccine needed       Relevant Orders   Flu Vaccine QUAD 6+ mos PF IM (Fluarix Quad PF) (Completed)     -Continue healthy lifestyle changes -Flu vaccine given -Other health  maintenance up-to-date with exception of needs to set up diabetic eye exam -Check labs as above including A1c and urine microalbumin -Consider statin therapy.  Wait on labs first.  No follow-ups on file.    Evelena Peat, MD

## 2022-07-16 MED ORDER — ROSUVASTATIN CALCIUM 10 MG PO TABS: 10.0000 mg | ORAL_TABLET | Freq: Every day | ORAL | 0 refills | Status: DC

## 2022-07-16 NOTE — Addendum Note (Signed)
Addended by: Christy Sartorius on: 07/16/2022 03:22 PM   Modules accepted: Orders

## 2022-09-04 ENCOUNTER — Telehealth: Payer: Self-pay | Admitting: Family Medicine

## 2022-09-04 DIAGNOSIS — E785 Hyperlipidemia, unspecified: Secondary | ICD-10-CM

## 2022-09-04 MED ORDER — GEMFIBROZIL 600 MG PO TABS: ORAL_TABLET | ORAL | 3 refills | Status: DC

## 2022-09-04 MED ORDER — METFORMIN HCL 500 MG PO TABS: ORAL_TABLET | ORAL | 3 refills | Status: DC

## 2022-09-04 NOTE — Telephone Encounter (Signed)
Labs placed and lab appointment scheduled

## 2022-09-04 NOTE — Addendum Note (Signed)
Addended by: Nilda Riggs on: 09/04/2022 09:48 AM   Modules accepted: Orders

## 2022-09-04 NOTE — Addendum Note (Signed)
Addended by: Nilda Riggs on: 09/04/2022 01:12 PM   Modules accepted: Orders

## 2022-09-04 NOTE — Telephone Encounter (Signed)
Rx sent 

## 2022-09-04 NOTE — Telephone Encounter (Signed)
Pt also asking about labs to monitor Crestor that he says were discussed at his last appointment. No orders in system

## 2022-09-04 NOTE — Telephone Encounter (Signed)
gemfibrozil (LOPID) 600 MG tablet  metFORMIN (GLUCOPHAGE) 500 MG tablet   [facility] - [place], [place] - [address] Phone: [phone]  Fax: [phone]

## 2022-10-11 ENCOUNTER — Other Ambulatory Visit: Payer: BC Managed Care – PPO

## 2022-10-11 DIAGNOSIS — E785 Hyperlipidemia, unspecified: Secondary | ICD-10-CM

## 2022-10-11 LAB — LIPID PANEL
Cholesterol: 142 mg/dL (ref 0–200)
HDL: 37.5 mg/dL — ABNORMAL LOW (ref >39.00)
NonHDL: 104.19
Total CHOL/HDL Ratio: 4
Triglycerides: 221 mg/dL — ABNORMAL HIGH (ref 0.0–149.0)
VLDL: 44.2 mg/dL — ABNORMAL HIGH (ref 0.0–40.0)

## 2022-10-11 LAB — HEPATIC FUNCTION PANEL
ALT: 25 U/L (ref 0–53)
AST: 26 U/L (ref 0–37)
Albumin: 4.6 g/dL (ref 3.5–5.2)
Alkaline Phosphatase: 51 U/L (ref 39–117)
Bilirubin, Direct: 0.1 mg/dL (ref 0.0–0.3)
Total Bilirubin: 0.6 mg/dL (ref 0.2–1.2)
Total Protein: 7.1 g/dL (ref 6.0–8.3)

## 2022-10-11 LAB — LDL CHOLESTEROL, DIRECT: Direct LDL: 83 mg/dL

## 2022-10-15 ENCOUNTER — Telehealth: Payer: Self-pay | Admitting: Family Medicine

## 2022-10-15 MED ORDER — ROSUVASTATIN CALCIUM 10 MG PO TABS: 10.0000 mg | ORAL_TABLET | Freq: Every day | ORAL | 0 refills | Status: DC

## 2022-10-15 NOTE — Telephone Encounter (Signed)
Prescription Request  10/15/2022  Is this a "Controlled Substance" medicine? No  LOV: 07/12/2022  What is the name of the medication or equipment? rosuvastatin (CRESTOR) 10 MG tablet   Have you contacted your pharmacy to request a refill? Yes   Which pharmacy would you like this sent to?  [place], [place] [address] Phone: [phone] Fax: [phone]    Patient notified that their request is being sent to the clinical staff for review and that they should receive a response within 2 business days.   Please advise at Mobile [phone] (mobile)

## 2022-10-15 NOTE — Telephone Encounter (Signed)
Rx sent 

## 2022-10-15 NOTE — Addendum Note (Signed)
Addended by: Nilda Riggs on: 10/15/2022 05:01 PM   Modules accepted: Orders

## 2022-12-16 ENCOUNTER — Other Ambulatory Visit: Payer: Self-pay

## 2022-12-16 DIAGNOSIS — E785 Hyperlipidemia, unspecified: Secondary | ICD-10-CM

## 2022-12-16 MED ORDER — GEMFIBROZIL 600 MG PO TABS: ORAL_TABLET | ORAL | 2 refills | Status: DC

## 2022-12-17 ENCOUNTER — Other Ambulatory Visit: Payer: Self-pay

## 2022-12-17 MED ORDER — PANTOPRAZOLE SODIUM 40 MG PO TBEC: 40.0000 mg | DELAYED_RELEASE_TABLET | Freq: Every day | ORAL | 1 refills | Status: DC

## 2023-01-10 ENCOUNTER — Telehealth: Payer: Self-pay | Admitting: Family Medicine

## 2023-01-10 MED ORDER — ROSUVASTATIN CALCIUM 10 MG PO TABS: 10.0000 mg | ORAL_TABLET | Freq: Every day | ORAL | 1 refills | Status: DC

## 2023-01-10 NOTE — Addendum Note (Signed)
Addended by: Christy Sartorius on: 01/10/2023 04:50 PM   Modules accepted: Orders

## 2023-01-10 NOTE — Telephone Encounter (Signed)
Prescription Request  01/10/2023  LOV: 07/12/2022  What is the name of the medication or equipment?  rosuvastatin (CRESTOR) 10 MG tablet  requesting a years refill  Have you contacted your pharmacy to request a refill? No   Which pharmacy would you like this sent to?  [facility] - [place], [place] - [address] [place] [address] Phone: [phone] Fax: [phone]    Patient notified that their request is being sent to the clinical staff for review and that they should receive a response within 2 business days.   Please advise at Mobile [phone] (mobile)

## 2023-01-10 NOTE — Telephone Encounter (Signed)
Rx sent 

## 2023-01-13 ENCOUNTER — Other Ambulatory Visit: Payer: Self-pay

## 2023-01-13 MED ORDER — METFORMIN HCL 500 MG PO TABS: ORAL_TABLET | ORAL | 0 refills | Status: DC

## 2023-01-28 DIAGNOSIS — H0288A Meibomian gland dysfunction right eye, upper and lower eyelids: Secondary | ICD-10-CM | POA: Diagnosis not present

## 2023-01-28 DIAGNOSIS — Z7984 Long term (current) use of oral hypoglycemic drugs: Secondary | ICD-10-CM | POA: Diagnosis not present

## 2023-01-28 DIAGNOSIS — H0288B Meibomian gland dysfunction left eye, upper and lower eyelids: Secondary | ICD-10-CM | POA: Diagnosis not present

## 2023-01-28 DIAGNOSIS — E119 Type 2 diabetes mellitus without complications: Secondary | ICD-10-CM | POA: Diagnosis not present

## 2023-01-28 LAB — HM DIABETES EYE EXAM

## 2023-07-14 ENCOUNTER — Encounter: Payer: Self-pay | Admitting: Family Medicine

## 2023-07-14 ENCOUNTER — Ambulatory Visit (INDEPENDENT_AMBULATORY_CARE_PROVIDER_SITE_OTHER): Payer: BC Managed Care – PPO | Admitting: Family Medicine

## 2023-07-14 VITALS — BP 110/80 | HR 66 | Temp 98.3°F | Ht 68.0 in | Wt 222.2 lb

## 2023-07-14 DIAGNOSIS — Z0000 Encounter for general adult medical examination without abnormal findings: Secondary | ICD-10-CM | POA: Diagnosis not present

## 2023-07-14 DIAGNOSIS — Z7984 Long term (current) use of oral hypoglycemic drugs: Secondary | ICD-10-CM | POA: Diagnosis not present

## 2023-07-14 DIAGNOSIS — E1165 Type 2 diabetes mellitus with hyperglycemia: Secondary | ICD-10-CM

## 2023-07-14 DIAGNOSIS — Z23 Encounter for immunization: Secondary | ICD-10-CM | POA: Diagnosis not present

## 2023-07-14 LAB — HEPATIC FUNCTION PANEL
ALT: 19 U/L (ref 0–53)
AST: 21 U/L (ref 0–37)
Albumin: 4.8 g/dL (ref 3.5–5.2)
Alkaline Phosphatase: 54 U/L (ref 39–117)
Bilirubin, Direct: 0.1 mg/dL (ref 0.0–0.3)
Total Bilirubin: 0.7 mg/dL (ref 0.2–1.2)
Total Protein: 7.1 g/dL (ref 6.0–8.3)

## 2023-07-14 LAB — BASIC METABOLIC PANEL
BUN: 19 mg/dL (ref 6–23)
CO2: 24 meq/L (ref 19–32)
Calcium: 9.5 mg/dL (ref 8.4–10.5)
Chloride: 103 meq/L (ref 96–112)
Creatinine, Ser: 1.1 mg/dL (ref 0.40–1.50)
GFR: 73.73 mL/min (ref >60.00)
Glucose, Bld: 130 mg/dL — ABNORMAL HIGH (ref 70–99)
Potassium: 4.4 meq/L (ref 3.5–5.1)
Sodium: 138 meq/L (ref 135–145)

## 2023-07-14 LAB — CBC WITH DIFFERENTIAL/PLATELET
Basophils Absolute: 0 10*3/uL (ref 0.0–0.1)
Basophils Relative: 0.4 % (ref 0.0–3.0)
Eosinophils Absolute: 0.1 10*3/uL (ref 0.0–0.7)
Eosinophils Relative: 1.3 % (ref 0.0–5.0)
HCT: 43.5 % (ref 39.0–52.0)
Hemoglobin: 14.5 g/dL (ref 13.0–17.0)
Lymphocytes Relative: 43.9 % (ref 12.0–46.0)
Lymphs Abs: 2.9 10*3/uL (ref 0.7–4.0)
MCHC: 33.3 g/dL (ref 30.0–36.0)
MCV: 93 fL (ref 78.0–100.0)
Monocytes Absolute: 0.5 10*3/uL (ref 0.1–1.0)
Monocytes Relative: 7.2 % (ref 3.0–12.0)
Neutro Abs: 3.1 10*3/uL (ref 1.4–7.7)
Neutrophils Relative %: 47.2 % (ref 43.0–77.0)
Platelets: 258 10*3/uL (ref 150.0–400.0)
RBC: 4.68 Mil/uL (ref 4.22–5.81)
RDW: 13.4 % (ref 11.5–15.5)
WBC: 6.6 10*3/uL (ref 4.0–10.5)

## 2023-07-14 LAB — LIPID PANEL
Cholesterol: 152 mg/dL (ref 0–200)
HDL: 36.1 mg/dL — ABNORMAL LOW (ref >39.00)
LDL Cholesterol: 64 mg/dL (ref 0–99)
NonHDL: 116.15
Total CHOL/HDL Ratio: 4
Triglycerides: 261 mg/dL — ABNORMAL HIGH (ref 0.0–149.0)
VLDL: 52.2 mg/dL — ABNORMAL HIGH (ref 0.0–40.0)

## 2023-07-14 LAB — MICROALBUMIN / CREATININE URINE RATIO
Creatinine,U: 127.4 mg/dL
Microalb Creat Ratio: 0.6 mg/g (ref 0.0–30.0)
Microalb, Ur: 0.8 mg/dL (ref 0.0–1.9)

## 2023-07-14 LAB — PSA: PSA: 0.45 ng/mL (ref 0.10–4.00)

## 2023-07-14 LAB — HEMOGLOBIN A1C: Hgb A1c MFr Bld: 6.9 % — ABNORMAL HIGH (ref 4.6–6.5)

## 2023-07-14 MED ORDER — METFORMIN HCL 500 MG PO TABS: ORAL_TABLET | ORAL | 3 refills | Status: DC

## 2023-07-14 MED ORDER — ROSUVASTATIN CALCIUM 10 MG PO TABS: 10.0000 mg | ORAL_TABLET | Freq: Every day | ORAL | 3 refills | Status: DC

## 2023-07-14 NOTE — Patient Instructions (Signed)
See about insurance coverage for Lovaza (in place of the Gemfibrozil).

## 2023-07-14 NOTE — Progress Notes (Signed)
Established Patient Office Visit  Subjective   Patient ID: Andrew Shields, male    DOB: September 18, 1965  Age: 57 y.o. MRN: [id number]  Chief Complaint  Patient presents with   Annual Exam    HPI   Andrew Shields is seen today for physical exam.  He has history of GERD, type 2 diabetes, dyslipidemia.  His current medications include metformin, rosuvastatin, pantoprazole, Lopid.  He retired few years ago.  Stays very active with projects and volunteer work.  No formal exercise.  Has been on gemfibrozil for several years for hypertriglyceridemia.  No side effects.  Not monitoring blood sugars regularly.  Last A1c 6.8%.  Health maintenance reviewed:  Health Maintenance  Topic Date Due   HIV Screening  Never done   HEMOGLOBIN A1C  01/10/2023   INFLUENZA VACCINE  03/27/2023   COVID-19 Vaccine (3 - 2023-24 season) 04/27/2023   Diabetic kidney evaluation - eGFR measurement  07/13/2023   Diabetic kidney evaluation - Urine ACR  07/13/2023   FOOT EXAM  07/13/2023   OPHTHALMOLOGY EXAM  01/28/2024   DTaP/Tdap/Td (3 - Td or Tdap) 07/26/2030   Colonoscopy  10/13/2030   Hepatitis C Screening  Completed   Zoster Vaccines- Shingrix  Completed   HPV VACCINES  Aged Out   -Does consent to flu vaccine today  Social history-he is married.  His wife retired last year but she does still work couple days per week part-time.    they have no children.  Non-smoker.  No alcohol.  Stays very involved with church.   Family history-mother had colon cancer in his father had lung cancer.  His mother had history of heart disease and type 2 diabetes.  Father with history of alcohol abuse.  He has a sister with type 2 diabetes.  Also has a brother is had complications of type 2 diabetes  Past Medical History:  Diagnosis Date   Hyperlipidemia    Past Surgical History:  Procedure Laterality Date   CARPAL TUNNEL RELEASE Right    COLONOSCOPY  04/19/2010   RMR: 1. normal rectum 2. normal colon. 3. Normal terminal ileum     COLONOSCOPY N/A 06/06/2015   Dr. Elmer Ramp. Surveillance in 5 years    COLONOSCOPY WITH PROPOFOL N/A 10/13/2020   Procedure: COLONOSCOPY WITH PROPOFOL;  Surgeon: Corbin Ade, MD;  [facility];  Service: Endoscopy;  Laterality: N/A;  11:00am, covid + 1/18 <90 days   ESOPHAGEAL DILATION N/A 06/06/2015   Procedure: ESOPHAGEAL DILATION;  Surgeon: Corbin Ade, MD;  [facility];  Service: Endoscopy;  Laterality: N/A;   ESOPHAGOGASTRODUODENOSCOPY N/A 06/06/2015   Dr. Rourk:mild erosive reflux/HH/s/p empiric dilation   TONSILLECTOMY      reports that he has never smoked. He has never used smokeless tobacco. He reports that he does not drink alcohol and does not use drugs. family history includes Alcohol abuse in his father; Cancer in his mother; Cancer (age of onset: 39) in his father; Colon cancer in his mother; Diabetes in his mother and sister; Heart disease (age of onset: 3) in his mother; Hyperlipidemia in his brother. Allergies  Allergen Reactions   Amoxicillin-Pot Clavulanate Shortness Of Breath and Other (See Comments)    difficulty breathing     Review of Systems  Constitutional:  Negative for chills, fever, malaise/fatigue and weight loss.  HENT:  Negative for hearing loss.   Eyes:  Negative for blurred vision and double vision.  Respiratory:  Negative for cough and shortness of breath.  Cardiovascular:  Negative for chest pain, palpitations and leg swelling.  Gastrointestinal:  Negative for abdominal pain, blood in stool, constipation and diarrhea.  Genitourinary:  Negative for dysuria.  Skin:  Negative for rash.  Neurological:  Negative for dizziness, speech change, seizures, loss of consciousness and headaches.  Psychiatric/Behavioral:  Negative for depression.       Objective:     BP 110/80 (BP Location: Left Arm, Patient Position: Sitting, Cuff Size: Normal)   Pulse 66   Temp 98.3 F (36.8 C) (Oral)   Ht 5\' 8"  (1.727 m)   Wt 222 lb  3.2 oz (100.8 kg)   SpO2 99%   BMI 33.79 kg/m  BP Readings from Last 3 Encounters:  07/14/23 110/80  07/12/22 104/78  10/16/21 100/78   Wt Readings from Last 3 Encounters:  07/14/23 222 lb 3.2 oz (100.8 kg)  07/12/22 213 lb 4.8 oz (96.8 kg)  10/16/21 221 lb 9.6 oz (100.5 kg)      Physical Exam Vitals reviewed.  Constitutional:      General: He is not in acute distress.    Appearance: He is well-developed.  HENT:     Head: Normocephalic and atraumatic.     Right Ear: External ear normal.     Left Ear: External ear normal.  Eyes:     Conjunctiva/sclera: Conjunctivae normal.     Pupils: Pupils are equal, round, and reactive to light.  Neck:     Thyroid: No thyromegaly.  Cardiovascular:     Rate and Rhythm: Normal rate and regular rhythm.     Heart sounds: Normal heart sounds. No murmur heard. Pulmonary:     Effort: No respiratory distress.     Breath sounds: No wheezing or rales.  Abdominal:     General: Bowel sounds are normal. There is no distension.     Palpations: Abdomen is soft. There is no mass.     Tenderness: There is no abdominal tenderness. There is no guarding or rebound.  Musculoskeletal:     Cervical back: Normal range of motion and neck supple.     Right lower leg: No edema.     Left lower leg: No edema.  Lymphadenopathy:     Cervical: No cervical adenopathy.  Skin:    Findings: No rash.     Comments: Feet reveal no skin lesions. Good distal foot pulses. Good capillary refill. No calluses. Normal sensation with monofilament testing   Neurological:     Mental Status: He is alert and oriented to person, place, and time.     Cranial Nerves: No cranial nerve deficit.      No results found for any visits on 07/14/23.  Last CBC Lab Results  Component Value Date   WBC 6.6 07/12/2022   HGB 14.8 07/12/2022   HCT 43.3 07/12/2022   MCV 91.5 07/12/2022   RDW 13.2 07/12/2022   PLT 265.0 07/12/2022   Last metabolic panel Lab Results  Component  Value Date   GLUCOSE 135 (H) 07/12/2022   NA 138 07/12/2022   K 4.7 07/12/2022   CL 103 07/12/2022   CO2 27 07/12/2022   BUN 16 07/12/2022   CREATININE 0.90 07/12/2022   GFR 94.47 07/12/2022   CALCIUM 9.6 07/12/2022   PROT 7.1 10/11/2022   ALBUMIN 4.6 10/11/2022   BILITOT 0.6 10/11/2022   ALKPHOS 51 10/11/2022   AST 26 10/11/2022   ALT 25 10/11/2022   ANIONGAP 13 02/22/2020   Last lipids Lab Results  Component Value Date  CHOL 142 10/11/2022   HDL 37.50 (L) 10/11/2022   LDLCALC 128 (H) 08/28/2012   LDLDIRECT 83.0 10/11/2022   TRIG 221.0 (H) 10/11/2022   CHOLHDL 4 10/11/2022   Last hemoglobin A1c Lab Results  Component Value Date   HGBA1C 6.8 (H) 07/12/2022      The 10-year ASCVD risk score (Arnett DK, et al., 2019) is: 10.3%    Assessment & Plan:   Problem List Items Addressed This Visit       Unprioritized   Type 2 diabetes mellitus with hyperglycemia (HCC) - Primary   Relevant Medications   rosuvastatin (CRESTOR) 10 MG tablet   metFORMIN (GLUCOPHAGE) 500 MG tablet   Other Relevant Orders   Hemoglobin A1c   Microalbumin / creatinine urine ratio   Other Visit Diagnoses     Physical exam       Relevant Orders   Basic metabolic panel   Lipid panel   CBC with Differential/Platelet   Hepatic function panel   PSA     For physical exam.  Chronic medical history as above.  Type 2 diabetes in need of follow-up labs.  We discussed several items as follows  -Recommend flu vaccine and he consents -Continue annual diabetic eye exam -Check urine microalbumin screen -Check follow-up A1c -We did discuss possible switch to Lovaza from gemfibrozil and he will check on insurance coverage -Will recommend 60-month diabetic follow-up if A1c remains well-controlled and sooner if indicated by labs  Return in about 6 months (around 01/11/2024).    Evelena Peat, MD

## 2023-07-14 NOTE — Addendum Note (Signed)
Addended by: Christy Sartorius on: 07/14/2023 08:37 AM   Modules accepted: Orders

## 2023-07-15 ENCOUNTER — Telehealth: Payer: Self-pay | Admitting: Family Medicine

## 2023-07-15 MED ORDER — OMEGA-3-ACID ETHYL ESTERS 1 G PO CAPS: ORAL_CAPSULE | ORAL | 3 refills | Status: DC

## 2023-07-15 NOTE — Addendum Note (Signed)
Addended by: Christy Sartorius on: 07/15/2023 01:42 PM   Modules accepted: Orders

## 2023-07-15 NOTE — Telephone Encounter (Signed)
Says he spoke with med assistant and was wanting to speak with him again

## 2023-07-15 NOTE — Telephone Encounter (Signed)
Patient inquired since he was just prescribed Lovaza 1 g should he continue to take Fish oil 1000 mg capsules?

## 2023-07-15 NOTE — Telephone Encounter (Signed)
Patient informed of the message below and voiced understanding  

## 2023-08-29 ENCOUNTER — Encounter: Payer: Self-pay | Admitting: Family Medicine

## 2023-08-29 ENCOUNTER — Ambulatory Visit (INDEPENDENT_AMBULATORY_CARE_PROVIDER_SITE_OTHER): Payer: BC Managed Care – PPO | Admitting: Family Medicine

## 2023-08-29 VITALS — BP 146/84 | HR 85 | Temp 98.4°F | Ht 68.0 in | Wt 225.3 lb

## 2023-08-29 DIAGNOSIS — R062 Wheezing: Secondary | ICD-10-CM

## 2023-08-29 DIAGNOSIS — H66001 Acute suppurative otitis media without spontaneous rupture of ear drum, right ear: Secondary | ICD-10-CM

## 2023-08-29 DIAGNOSIS — R051 Acute cough: Secondary | ICD-10-CM | POA: Diagnosis not present

## 2023-08-29 MED ORDER — ALBUTEROL SULFATE (2.5 MG/3ML) 0.083% IN NEBU
2.5000 mg | INHALATION_SOLUTION | Freq: Once | RESPIRATORY_TRACT | Status: AC
  Administered 2023-08-29: 2.5 mg via RESPIRATORY_TRACT

## 2023-08-29 MED ORDER — ALBUTEROL SULFATE HFA 108 (90 BASE) MCG/ACT IN AERS: 2.0000 | INHALATION_SPRAY | Freq: Four times a day (QID) | RESPIRATORY_TRACT | 0 refills | Status: DC | PRN

## 2023-08-29 MED ORDER — PREDNISONE 20 MG PO TABS: ORAL_TABLET | ORAL | 0 refills | Status: DC

## 2023-08-29 MED ORDER — DOXYCYCLINE HYCLATE 100 MG PO CAPS: 100.0000 mg | ORAL_CAPSULE | Freq: Two times a day (BID) | ORAL | 0 refills | Status: DC

## 2023-08-29 NOTE — Progress Notes (Signed)
 Established Patient Office Visit  Subjective   Patient ID: Andrew Shields, male    DOB: [birth year]  Age: 58 y.o. MRN: [id number]  Chief Complaint  Patient presents with   Cough    Cough and congestion started a week ago, and right ear pain,     HPI   Andrew Shields has past medical history significant for GERD, type 2 diabetes recently diagnosed, hyperlipidemia.  He is seen today with onset of respiratory symptoms last week.  He has had some progressive right ear pain.  Cough productive of green sputum.  Some increased wheezing.  Increased fatigue.  No documented fever.  He has no history of asthma.  Never smoked.  Has maintained his appetite.  Denies any nausea, vomiting, or diarrhea.  Past Medical History:  Diagnosis Date   Hyperlipidemia    Past Surgical History:  Procedure Laterality Date   CARPAL TUNNEL RELEASE Right    COLONOSCOPY  04/19/2010   RMR: 1. normal rectum 2. normal colon. 3. Normal terminal ileum    COLONOSCOPY N/A 06/06/2015   Dr. Charley. Surveillance in 5 years    COLONOSCOPY WITH PROPOFOL  N/A 10/13/2020   Procedure: COLONOSCOPY WITH PROPOFOL ;  Surgeon: Shaaron Lamar HERO, MD;  [facility];  Service: Endoscopy;  Laterality: N/A;  11:00am, covid + 1/18 <90 days   ESOPHAGEAL DILATION N/A 06/06/2015   Procedure: ESOPHAGEAL DILATION;  Surgeon: Lamar HERO Shaaron, MD;  [facility];  Service: Endoscopy;  Laterality: N/A;   ESOPHAGOGASTRODUODENOSCOPY N/A 06/06/2015   Dr. Rourk:mild erosive reflux/HH/s/p empiric dilation   TONSILLECTOMY      reports that he has never smoked. He has never used smokeless tobacco. He reports that he does not drink alcohol and does not use drugs. family history includes Alcohol abuse in his father; Cancer in his mother; Cancer (age of onset: 59) in his father; Colon cancer in his mother; Diabetes in his mother and sister; Heart disease (age of onset: 31) in his mother; Hyperlipidemia in his brother. Allergies  Allergen  Reactions   Amoxicillin-Pot Clavulanate Shortness Of Breath and Other (See Comments)    difficulty breathing    Review of Systems  Constitutional:  Positive for malaise/fatigue. Negative for chills and fever.  HENT:  Positive for congestion and ear pain. Negative for ear discharge.   Respiratory:  Positive for cough, sputum production and wheezing. Negative for hemoptysis.   Cardiovascular:  Negative for chest pain.      Objective:     BP (!) 146/84 (BP Location: Right Arm, Patient Position: Sitting, Cuff Size: Large)   Pulse 85   Temp 98.4 F (36.9 C) (Oral)   Ht 5' 8 (1.727 m)   Wt 225 lb 4.8 oz (102.2 kg)   SpO2 97%   BMI 34.26 kg/m  BP Readings from Last 3 Encounters:  08/29/23 (!) 146/84  07/14/23 110/80  07/12/22 104/78   Wt Readings from Last 3 Encounters:  08/29/23 225 lb 4.8 oz (102.2 kg)  07/14/23 222 lb 3.2 oz (100.8 kg)  07/12/22 213 lb 4.8 oz (96.8 kg)      Physical Exam Vitals reviewed.  Constitutional:      General: He is not in acute distress.    Appearance: He is well-developed. He is not ill-appearing.  HENT:     Ears:     Comments: Left eardrum is normal.  Right eardrum reveals significant erythema.  Landmarks are not distorted. Eyes:     Pupils: Pupils are equal, round, and  reactive to light.  Neck:     Thyroid : No thyromegaly.  Cardiovascular:     Rate and Rhythm: Normal rate and regular rhythm.  Pulmonary:     Effort: Pulmonary effort is normal.     Comments: He does have wheezing throughout.  No rales.  No retractions. Musculoskeletal:     Cervical back: Neck supple.  Neurological:     Mental Status: He is alert and oriented to person, place, and time.      No results found for any visits on 08/29/23.    The 10-year ASCVD risk score (Arnett DK, et al., 2019) is: 17.9%    Assessment & Plan:   Patient seen with acute illness.  Suspect probably viral trigger.  He has the following significant findings:  -Evidence for  probable acute right otitis media.  He has significant erythema of the right eardrum -Cough with increased diffuse wheezing.  No history of asthma.  Does not have any red flags such as fever or rales  -Albuterol  nebulizer -Stay well-hydrated -Start doxycycline  100 mg twice daily for 10 days -Cautious short trial of prednisone  20 mg 2 tablets daily for 5 days and monitor blood sugars closely.  He is aware this may exacerbate blood short-term  -Patient was given albuterol  nebulizer 0.083% solution 2.5 mL and felt much better afterwards.  Lung exam some improved.  Still had some wheezing but overall improved  Wolm Scarlet, MD

## 2023-09-16 ENCOUNTER — Ambulatory Visit: Payer: Self-pay | Admitting: Family Medicine

## 2023-09-16 NOTE — Telephone Encounter (Signed)
Copied from CRM [id number]. Topic: Clinical - Medication Question >> Sep 16, 2023 12:16 PM Irine Seal wrote: Reason for CRM: Pt was seen 08/29/23 for sinus congestion, pt is feeling better. But his right ear is clogged and feeling full, and is having trouble hearing. Tinnitus is present. Pt is requesting direction on what to do next, he wants to know if he can have either an antibiotic or something called in or if he needs a referral to ENT. [phone]   Chief Complaint: Right Ear Congestion Symptoms: Tinnitus, Hearing loss, Dizziness Frequency: For a couple of days.  Pertinent Negatives: Patient denies any other symptoms.  Disposition: [] ED /[] Urgent Care (no appt availability in office) / [x] Appointment(In office/virtual)/ []  [facility]/ [] Home Care/ [] Refused Recommended Disposition /[] [facility]/ []  Follow-up with PCP Additional Notes: TB is a 58 year old male triaged today for ear congestion and hearing loss. The patient denies a runny nose, fever, or any other cold symptoms. Only states a mild cough. Recent sinus infection that required antibiotics. States that sound in the right ear sounds low and muffled. In office appointment made for tomorrow.    Reason for Disposition  Ear congestion present > 48 hours  Answer Assessment - Initial Assessment Questions 1. LOCATION: "Which ear is involved?"       Right Ear  2. SENSATION: "Describe how the ear feels." (e.g. stuffy, full, plugged)."      Full, Loss of Hearing  3. ONSET:  "When did the ear symptoms start?"       Since Last Wednesday  4. PAIN: "Do you also have an earache?" If Yes, ask: "How bad is it?" (Scale 1-10; or mild, moderate, severe)     Mild  5. CAUSE: "What do you think is causing the ear congestion?"     Unsure  6. URI: "Do you have a runny nose or cough?"      Cough  7. NASAL ALLERGIES: "Are there symptoms of hay fever, such as sneezing or a clear nasal discharge?"     No  Protocols used:  Ear - Congestion-A-AH

## 2023-09-17 ENCOUNTER — Ambulatory Visit (INDEPENDENT_AMBULATORY_CARE_PROVIDER_SITE_OTHER): Payer: BC Managed Care – PPO | Admitting: Family Medicine

## 2023-09-17 ENCOUNTER — Encounter: Payer: Self-pay | Admitting: Family Medicine

## 2023-09-17 DIAGNOSIS — H9191 Unspecified hearing loss, right ear: Secondary | ICD-10-CM | POA: Diagnosis not present

## 2023-09-17 NOTE — Progress Notes (Signed)
Established Patient Office Visit  Subjective   Patient ID: Andrew Shields, male    DOB: 1966-02-19  Age: 58 y.o. MRN: [id number]  Chief Complaint  Patient presents with   Ear Pain    Patient complains of Bilateral ear pain, Patient reports redness in right ear    Hearing Loss   Tinnitus    Patient complains of tinnitus in right ear    HPI   Andrew Shields is seen today with persistent ear symptoms right greater than left.  Was just seen here recently on the third with probable viral syndrome.  He has some cough and wheezing.  Evidence on exam for acute right otitis media.  Was treated with doxycycline 100 mg twice daily for 10 days and short course of prednisone.  He called back yesterday that he had some decreased hearing in the right ear since last visit.  Also has some persistent low-grade tinnitus involving the right ear.  No pulsatile tinnitus.  No vertigo.  No fever.  No ear drainage.  Past Medical History:  Diagnosis Date   Hyperlipidemia    Past Surgical History:  Procedure Laterality Date   CARPAL TUNNEL RELEASE Right    COLONOSCOPY  04/19/2010   RMR: 1. normal rectum 2. normal colon. 3. Normal terminal ileum    COLONOSCOPY N/A 06/06/2015   Dr. Elmer Ramp. Surveillance in 5 years    COLONOSCOPY WITH PROPOFOL N/A 10/13/2020   Procedure: COLONOSCOPY WITH PROPOFOL;  Surgeon: Corbin Ade, MD;  [facility];  Service: Endoscopy;  Laterality: N/A;  11:00am, covid + 1/18 <90 days   ESOPHAGEAL DILATION N/A 06/06/2015   Procedure: ESOPHAGEAL DILATION;  Surgeon: Corbin Ade, MD;  [facility];  Service: Endoscopy;  Laterality: N/A;   ESOPHAGOGASTRODUODENOSCOPY N/A 06/06/2015   Dr. Rourk:mild erosive reflux/HH/s/p empiric dilation   TONSILLECTOMY      reports that he has never smoked. He has never used smokeless tobacco. He reports that he does not drink alcohol and does not use drugs. family history includes Alcohol abuse in his father; Cancer in his  mother; Cancer (age of onset: 41) in his father; Colon cancer in his mother; Diabetes in his mother and sister; Heart disease (age of onset: 71) in his mother; Hyperlipidemia in his brother. Allergies  Allergen Reactions   Amoxicillin-Pot Clavulanate Shortness Of Breath and Other (See Comments)    difficulty breathing    Review of Systems  Constitutional:  Negative for chills and fever.  HENT:  Positive for ear pain, hearing loss and tinnitus. Negative for congestion, ear discharge and sinus pain.   Neurological:  Negative for headaches.      Objective:     There were no vitals taken for this visit. BP Readings from Last 3 Encounters:  08/29/23 (!) 146/84  07/14/23 110/80  07/12/22 104/78   Wt Readings from Last 3 Encounters:  08/29/23 225 lb 4.8 oz (102.2 kg)  07/14/23 222 lb 3.2 oz (100.8 kg)  07/12/22 213 lb 4.8 oz (96.8 kg)      Physical Exam Vitals reviewed.  Constitutional:      General: He is not in acute distress. HENT:     Ears:     Comments: No significant cerumen in either canal.  Left eardrum is normal.  Right eardrum reveals only minimal erythema along the handle of the malleus but no visible effusion.  No bulging.  No eardrum perforation. Cardiovascular:     Rate and Rhythm: Normal rate and regular  rhythm.  Musculoskeletal:     Cervical back: Neck supple.  Neurological:     Mental Status: He is alert.      No results found for any visits on 09/17/23.    The 10-year ASCVD risk score (Arnett DK, et al., 2019) is: 17.9%    Assessment & Plan:   Problem List Items Addressed This Visit   None Visit Diagnoses       Hearing loss of right ear, unspecified hearing loss type    -  Primary   Relevant Orders   Ambulatory referral to ENT     Patient relates rather acute right hearing loss following recent URI.  He had evidence for acute otitis media couple weeks ago and was treated with doxycycline.  He has allergy to penicillin.  No visible effusion  at this time.  No cerumen.  Was treated recently with prednisone without improvement He does have some persistent low-grade tinnitus right ear but no pulsatile tinnitus  -Set up ENT referral  No follow-ups on file.    Evelena Peat, MD

## 2023-10-09 ENCOUNTER — Other Ambulatory Visit: Payer: Self-pay | Admitting: Family Medicine

## 2023-10-09 MED ORDER — PANTOPRAZOLE SODIUM 40 MG PO TBEC: 40.0000 mg | DELAYED_RELEASE_TABLET | Freq: Every day | ORAL | 1 refills | Status: DC

## 2023-10-09 NOTE — Telephone Encounter (Signed)
Copied from CRM [id number]. Topic: Clinical - Medication Refill >> Oct 09, 2023  9:12 AM Fredrich Romans wrote: Most Recent Primary Care Visit:  Provider: [name]  Department: [facility]  Visit Type: ACUTE  Date: 09/17/2023  Medication: pantoprazole (PROTONIX) 40 MG tablet  Has the patient contacted their pharmacy? Yes (Agent: If no, request that the patient contact the pharmacy for the refill. If patient does not wish to contact the pharmacy document the reason why and proceed with request.) (Agent: If yes, when and what did the pharmacy advise?)  Is this the correct pharmacy for this prescription? Yes If no, delete pharmacy and type the correct one.  This is the patient's preferred pharmacy:  [facility] - [place], [place] - [address] [place] [address] Phone: [phone] Fax: [phone]   Has the prescription been filled recently? Yes  Is the patient out of the medication? No-few pills left  Has the patient been seen for an appointment in the last year OR does the patient have an upcoming appointment? Yes  Can we respond through MyChart? Yes  Agent: Please be advised that Rx refills may take up to 3 business days. We ask that you follow-up with your pharmacy.

## 2023-11-05 ENCOUNTER — Telehealth (INDEPENDENT_AMBULATORY_CARE_PROVIDER_SITE_OTHER): Payer: Self-pay | Admitting: Otolaryngology

## 2023-11-05 NOTE — Telephone Encounter (Signed)
 Confirmed appt & location [id number] af[facility]

## 2023-11-06 ENCOUNTER — Ambulatory Visit (INDEPENDENT_AMBULATORY_CARE_PROVIDER_SITE_OTHER): Payer: BC Managed Care – PPO | Admitting: Audiology

## 2023-11-06 ENCOUNTER — Encounter (INDEPENDENT_AMBULATORY_CARE_PROVIDER_SITE_OTHER): Payer: Self-pay

## 2023-11-06 ENCOUNTER — Ambulatory Visit (INDEPENDENT_AMBULATORY_CARE_PROVIDER_SITE_OTHER): Payer: BC Managed Care – PPO | Admitting: Otolaryngology

## 2023-11-06 VITALS — BP 129/84 | HR 64 | Ht 69.0 in | Wt 220.0 lb

## 2023-11-06 DIAGNOSIS — R0981 Nasal congestion: Secondary | ICD-10-CM

## 2023-11-06 DIAGNOSIS — H903 Sensorineural hearing loss, bilateral: Secondary | ICD-10-CM

## 2023-11-06 DIAGNOSIS — H9201 Otalgia, right ear: Secondary | ICD-10-CM | POA: Diagnosis not present

## 2023-11-06 DIAGNOSIS — H9313 Tinnitus, bilateral: Secondary | ICD-10-CM | POA: Diagnosis not present

## 2023-11-06 DIAGNOSIS — J310 Chronic rhinitis: Secondary | ICD-10-CM

## 2023-11-06 MED ORDER — FLUTICASONE PROPIONATE 50 MCG/ACT NA SUSP: 2.0000 | Freq: Every day | NASAL | 10 refills | Status: DC

## 2023-11-06 NOTE — Progress Notes (Signed)
  [address], [address] [place], [place] [address] [phone]  Audiological Evaluation    Name: Andrew Shields     DOB:   01-16-1966      MRN:   [id number]                                                                                     Service Date: 11/06/2023     Accompanied by: unaccompanied    Patient comes today after Dr. Suszanne Conners, ENT sent a referral for a hearing evaluation due to concerns with hearing loss and tinnitus since he had a cold/URI.   Symptoms Yes Details  Hearing loss  [x]  Left ear feels clogged today  Tinnitus  [x]  Reported in both ears, the right ear may a clicking when he opens/closes his jaw  Ear pain/ infections/pressure  [x]  Had right ear pain  Balance problems  []    Noise exposure history  []    Previous ear surgeries  [x]  Unsure if he had PE tubes when younger  Family history of hearing loss  [x]  Maybe brother and father (had occupational noise exposure)  Amplification  []    Other  []      Otoscopy: Right ear: Clear external ear canals and notable landmarks visualized on the tympanic membrane. Left ear:  Clear external ear canals and notable landmarks visualized on the tympanic membrane.  Tympanometry: Right ear: Type A- Normal external ear canal volume with normal middle ear pressure and tympanic membrane compliance Left ear: Type A- Normal external ear canal volume with normal middle ear pressure and tympanic membrane compliance  Distortion Product Otoacoustic Emissions: Equipment not available.  Pure tone Audiometry: Right ear- Normal hearing from [phone], except for a mild sensorineural hearing loss at 4000 Hz. Left ear-  Normal hearing from [phone], then mild sensorineural hearing loss from 4000 Hz - 8000 Hz.  Speech Audiometry: Right ear- Speech Reception Threshold (SRT) was obtained at 10 dBHL. Left ear-Speech Reception Threshold (SRT) was obtained at 15 dBHL.   Word Recognition Score Tested using NU-6 (MLV) Right ear:  96% was obtained at a presentation level of 55 dBHL with contralateral masking which is deemed as  excellent. Left ear: 96% was obtained at a presentation level of 55 dBHL with contralateral masking which is deemed as  excellent.   The hearing test results were completed under headphones and re-checked with inserts and results are deemed to be of good reliability. Test technique:  conventional      Recommendations: Follow up with ENT as scheduled for today. Return for a hearing evaluation if concerns with hearing changes arise or per MD recommendation. Use hearing protection when exposed to loud/damaging sounds.    Jessabelle Markiewicz MARIE LEROUX-MARTINEZ, AUD

## 2023-11-07 DIAGNOSIS — H9313 Tinnitus, bilateral: Secondary | ICD-10-CM | POA: Insufficient documentation

## 2023-11-07 DIAGNOSIS — H9201 Otalgia, right ear: Secondary | ICD-10-CM | POA: Insufficient documentation

## 2023-11-07 DIAGNOSIS — J310 Chronic rhinitis: Secondary | ICD-10-CM | POA: Insufficient documentation

## 2023-11-07 DIAGNOSIS — H903 Sensorineural hearing loss, bilateral: Secondary | ICD-10-CM | POA: Insufficient documentation

## 2023-11-07 NOTE — Progress Notes (Signed)
 Patient ID: Andrew Shields, male   DOB: [birth year], 58 y.o.   MRN: [id number]  CC: Right ear pain, hearing loss  HPI:  Andrew Shields is a 58 y.o. male who presents today complaining of recurrent right ear pain since January 2025.  He describes the pain as a brief stabbing pain into the right ear.  He also noted a sudden drop in his right ear hearing in January.  He was treated with doxycycline and prednisone.  He is hearing has gradually improved.  Currently he complains of bilateral tinnitus.  He denies any otorrhea or vertigo.  He has no previous otologic surgery.  He complains of occasional nasal congestion.  Past Medical History:  Diagnosis Date   Hyperlipidemia     Past Surgical History:  Procedure Laterality Date   CARPAL TUNNEL RELEASE Right    COLONOSCOPY  04/19/2010   RMR: 1. normal rectum 2. normal colon. 3. Normal terminal ileum    COLONOSCOPY N/A 06/06/2015   Dr. Elmer Ramp. Surveillance in 5 years    COLONOSCOPY WITH PROPOFOL N/A 10/13/2020   Procedure: COLONOSCOPY WITH PROPOFOL;  Surgeon: Corbin Ade, MD;  [facility]: [facility];  Service: Endoscopy;  Laterality: N/A;  11:00am, covid + 1/18 <90 days   ESOPHAGEAL DILATION N/A 06/06/2015   Procedure: ESOPHAGEAL DILATION;  Surgeon: Corbin Ade, MD;  Location: [facility];  Service: Endoscopy;  Laterality: N/A;   ESOPHAGOGASTRODUODENOSCOPY N/A 06/06/2015   Dr. Rourk:mild erosive reflux/HH/s/p empiric dilation   TONSILLECTOMY      Family History  Problem Relation Age of Onset   Cancer Mother        colon cancer   Colon cancer Mother        age late 67s   Heart disease Mother 17       stent   Diabetes Mother        type ll   Alcohol abuse Father    Cancer Father 67       lung   Diabetes Sister        ?type 2   Hyperlipidemia Brother     Social History:  reports that he has never smoked. He has never used smokeless tobacco. He reports that he does not drink alcohol and does not use  drugs.  Allergies:  Allergies  Allergen Reactions   Amoxicillin-Pot Clavulanate Shortness Of Breath and Other (See Comments)    difficulty breathing    Prior to Admission medications   Medication Sig Start Date End Date Taking? Authorizing Provider  albuterol (VENTOLIN HFA) 108 (90 Base) MCG/ACT inhaler Inhale 2 puffs into the lungs every 6 (six) hours as needed for wheezing or shortness of breath. 08/29/23  Yes Burchette, Elberta Fortis, MD  Cyanocobalamin (B-12) 5000 MCG SUBL Place 1 mL under the tongue daily.   Yes [provider]  fluticasone (FLONASE) 50 MCG/ACT nasal spray Place 2 sprays into both nostrils daily. 11/06/23 12/06/23 Yes Newman Pies, MD  metFORMIN (GLUCOPHAGE) 500 MG tablet TAKE 1 TABLET BY MOUTH 2 TIMES A DAY WITH A MEAL. 07/14/23  Yes Burchette, Elberta Fortis, MD  omega-3 acid ethyl esters (LOVAZA) 1 g capsule Take 2 capsules by mouth twice daily 07/15/23  Yes Burchette, Elberta Fortis, MD  pantoprazole (PROTONIX) 40 MG tablet Take 1 tablet (40 mg total) by mouth daily. 10/09/23  Yes Burchette, Elberta Fortis, MD  rosuvastatin (CRESTOR) 10 MG tablet Take 1 tablet (10 mg total) by mouth daily. 07/14/23  Yes Burchette, Elberta Fortis, MD  Blood pressure 129/84, pulse 64, height 5\' 9"  (1.753 m), weight 220 lb (99.8 kg), SpO2 95%. Exam: General: Communicates without difficulty, well nourished, no acute distress. Head: Normocephalic, no evidence injury, no tenderness, facial buttresses intact without stepoff. Face/sinus: No tenderness to palpation and percussion. Facial movement is normal and symmetric. Eyes: PERRL, EOMI. No scleral icterus, conjunctivae clear. Neuro: CN II exam reveals vision grossly intact.  No nystagmus at any point of gaze. Ears: Auricles well formed without lesions.  Ear canals are intact without mass or lesion.  No erythema or edema is appreciated.  The TMs are intact without fluid. Nose: External evaluation reveals normal support and skin without lesions.  Dorsum is intact.  Anterior  rhinoscopy reveals congested mucosa over anterior aspect of inferior turbinates and intact septum.  No purulence noted. Oral:  Oral cavity and oropharynx are intact, symmetric, without erythema or edema.  Mucosa is moist without lesions. Neck: Full range of motion without pain.  There is no significant lymphadenopathy.  No masses palpable.  Thyroid bed within normal limits to palpation.  Parotid glands and submandibular glands equal bilaterally without mass.  Trachea is midline. Neuro:  CN 2-12 grossly intact.   His hearing test shows bilateral mild high-frequency sensorineural hearing loss.  Assessment: 1.  Referred right otalgia, likely secondary to musculoskeletal or neurogenic causes. 2.  His ear canals, tympanic membranes, and middle ear spaces are normal. 3.  Bilateral mild high-frequency sensorineural hearing loss. 4.  Bilateral subjective tinnitus, likely secondary to his hearing loss. 5.  Chronic rhinitis with nasal mucosal congestion.  Plan: 1.  The physical exam findings are reviewed with the patient.  The hearing test results also reviewed. 2.  Flonase nasal spray 2 sprays each nostril daily. 3.  NSAID as needed to treat the referred otalgia. 4.  The patient is reassured that no acute infection is noted today. 5.  The patient will return for reevaluation in 2 months.  Lexiana Spindel W Janee Ureste 11/07/2023, 3:41 PM  .

## 2023-11-12 ENCOUNTER — Encounter: Payer: Self-pay | Admitting: Audiology

## 2024-01-06 ENCOUNTER — Ambulatory Visit (INDEPENDENT_AMBULATORY_CARE_PROVIDER_SITE_OTHER)

## 2024-01-12 ENCOUNTER — Ambulatory Visit: Payer: Self-pay | Admitting: Family Medicine

## 2024-01-12 ENCOUNTER — Ambulatory Visit: Payer: BC Managed Care – PPO | Admitting: Family Medicine

## 2024-01-12 VITALS — BP 124/84 | HR 67 | Temp 97.9°F | Wt 226.6 lb

## 2024-01-12 DIAGNOSIS — Z7984 Long term (current) use of oral hypoglycemic drugs: Secondary | ICD-10-CM

## 2024-01-12 DIAGNOSIS — E785 Hyperlipidemia, unspecified: Secondary | ICD-10-CM

## 2024-01-12 DIAGNOSIS — Z8249 Family history of ischemic heart disease and other diseases of the circulatory system: Secondary | ICD-10-CM | POA: Diagnosis not present

## 2024-01-12 DIAGNOSIS — E1165 Type 2 diabetes mellitus with hyperglycemia: Secondary | ICD-10-CM | POA: Diagnosis not present

## 2024-01-12 LAB — HEPATIC FUNCTION PANEL
ALT: 20 U/L (ref 0–53)
AST: 19 U/L (ref 0–37)
Albumin: 4.8 g/dL (ref 3.5–5.2)
Alkaline Phosphatase: 61 U/L (ref 39–117)
Bilirubin, Direct: 0.1 mg/dL (ref 0.0–0.3)
Total Bilirubin: 0.6 mg/dL (ref 0.2–1.2)
Total Protein: 7.3 g/dL (ref 6.0–8.3)

## 2024-01-12 LAB — LIPID PANEL
Cholesterol: 165 mg/dL (ref 0–200)
HDL: 40.3 mg/dL (ref >39.00)
NonHDL: 124.83
Total CHOL/HDL Ratio: 4
Triglycerides: 402 mg/dL — ABNORMAL HIGH (ref 0.0–149.0)
VLDL: 80.4 mg/dL — ABNORMAL HIGH (ref 0.0–40.0)

## 2024-01-12 LAB — POCT GLYCOSYLATED HEMOGLOBIN (HGB A1C): Hemoglobin A1C: 6.7 % — AB (ref 4.0–5.6)

## 2024-01-12 LAB — LDL CHOLESTEROL, DIRECT: Direct LDL: 78 mg/dL

## 2024-01-12 NOTE — Patient Instructions (Signed)
 A1C today is 6.7 (was 6.9).

## 2024-01-12 NOTE — Progress Notes (Signed)
 Established Patient Office Visit  Subjective   Patient ID: Andrew Shields, male    DOB: [birth year]  Age: 58 y.o. MRN: [id number]  Chief Complaint  Patient presents with   Medical Management of Chronic Issues    HPI   Andrew Shields is seen for medical follow-up.  He has history of GERD, type 2 diabetes, dyslipidemia.  Last fall we had switched him to Lovaza  from gemfibrozil .  He also takes rosuvastatin  10 mg daily.  Tolerating well with no side effects.  He does have type 2 diabetes and last A1c was 6.9%.  On metformin  500 mg twice daily.  He does have a glucose meter but does not check regularly.  Recent fasting glucose 124.  Stays very active with landscaping part-time.  He is retired from [employer].  He also exercises fairly regularly.  No recent chest pains.  Weight is up 4 pounds from last visit.  He does have family history of CAD in his mother in her 71s.  She also had type 2 diabetes.  Past Medical History:  Diagnosis Date   Hyperlipidemia    Past Surgical History:  Procedure Laterality Date   CARPAL TUNNEL RELEASE Right    COLONOSCOPY  04/19/2010   RMR: 1. normal rectum 2. normal colon. 3. Normal terminal ileum    COLONOSCOPY N/A 06/06/2015   Dr. Davonna Estes. Surveillance in 5 years    COLONOSCOPY WITH PROPOFOL  N/A 10/13/2020   Procedure: COLONOSCOPY WITH PROPOFOL ;  Surgeon: Suzette Espy, MD;  [facility]: [facility];  Service: Endoscopy;  Laterality: N/A;  11:00am, covid + 1/18 <90 days   ESOPHAGEAL DILATION N/A 06/06/2015   Procedure: ESOPHAGEAL DILATION;  Surgeon: Suzette Espy, MD;  [facility]: [facility];  Service: Endoscopy;  Laterality: N/A;   ESOPHAGOGASTRODUODENOSCOPY N/A 06/06/2015   Dr. Rourk:mild erosive reflux/HH/s/p empiric dilation   TONSILLECTOMY      reports that he has never smoked. He has never used smokeless tobacco. He reports that he does not drink alcohol and does not use drugs. family history includes Alcohol abuse in his father; Cancer in  his mother; Cancer (age of onset: 12) in his father; Colon cancer in his mother; Diabetes in his mother and sister; Heart disease (age of onset: 65) in his mother; Hyperlipidemia in his brother. Allergies  Allergen Reactions   Amoxicillin-Pot Clavulanate Shortness Of Breath and Other (See Comments)    difficulty breathing    The 10-year ASCVD risk score (Arnett DK, et al., 2019) is: 13.8%   Values used to calculate the score:     Age: 59 years     Sex: Male     Is Non-Hispanic African American: No     Diabetic: Yes     Tobacco smoker: No     Systolic Blood Pressure: 124 mmHg     Is BP treated: No     HDL Cholesterol: 36.1 mg/dL     Total Cholesterol: 152 mg/dL   Review of Systems  Constitutional:  Negative for malaise/fatigue.  Eyes:  Negative for blurred vision.  Respiratory:  Negative for shortness of breath.   Cardiovascular:  Negative for chest pain.  Neurological:  Negative for dizziness, weakness and headaches.      Objective:      BP 124/84 (BP Location: Left Arm, Patient Position: Sitting, Cuff Size: Normal)   Pulse 67   Temp 97.9 F (36.6 C) (Oral)   Wt 226 lb 9.6 oz (102.8 kg)   SpO2 96%   BMI  33.46 kg/m  BP Readings from Last 3 Encounters:  01/12/24 124/84  11/06/23 129/84  08/29/23 (!) 146/84   Wt Readings from Last 3 Encounters:  01/12/24 226 lb 9.6 oz (102.8 kg)  11/06/23 220 lb (99.8 kg)  08/29/23 225 lb 4.8 oz (102.2 kg)      Physical Exam Vitals reviewed.  Constitutional:      Appearance: He is well-developed.  HENT:     Right Ear: External ear normal.     Left Ear: External ear normal.  Eyes:     Pupils: Pupils are equal, round, and reactive to light.  Neck:     Thyroid : No thyromegaly.  Cardiovascular:     Rate and Rhythm: Normal rate and regular rhythm.  Pulmonary:     Effort: Pulmonary effort is normal. No respiratory distress.     Breath sounds: Normal breath sounds. No wheezing or rales.  Musculoskeletal:     Cervical back:  Neck supple.     Right lower leg: No edema.     Left lower leg: No edema.  Neurological:     Mental Status: He is alert and oriented to person, place, and time.      Results for orders placed or performed in visit on 01/12/24  POC HgB A1c  Result Value Ref Range   Hemoglobin A1C 6.7 (A) 4.0 - 5.6 %   HbA1c POC (<> result, manual entry)     HbA1c, POC (prediabetic range)     HbA1c, POC (controlled diabetic range)      Last CBC Lab Results  Component Value Date   WBC 6.6 07/14/2023   HGB 14.5 07/14/2023   HCT 43.5 07/14/2023   MCV 93.0 07/14/2023   RDW 13.4 07/14/2023   PLT 258.0 07/14/2023   Last metabolic panel Lab Results  Component Value Date   GLUCOSE 130 (H) 07/14/2023   NA 138 07/14/2023   K 4.4 07/14/2023   CL 103 07/14/2023   CO2 24 07/14/2023   BUN 19 07/14/2023   CREATININE 1.10 07/14/2023   GFR 73.73 07/14/2023   CALCIUM  9.5 07/14/2023   PROT 7.1 07/14/2023   ALBUMIN 4.8 07/14/2023   BILITOT 0.7 07/14/2023   ALKPHOS 54 07/14/2023   AST 21 07/14/2023   ALT 19 07/14/2023   ANIONGAP 13 02/22/2020   Last lipids Lab Results  Component Value Date   CHOL 152 07/14/2023   HDL 36.10 (L) 07/14/2023   LDLCALC 64 07/14/2023   LDLDIRECT 83.0 10/11/2022   TRIG 261.0 (H) 07/14/2023   CHOLHDL 4 07/14/2023   Last hemoglobin A1c Lab Results  Component Value Date   HGBA1C 6.7 (A) 01/12/2024      The 10-year ASCVD risk score (Arnett DK, et al., 2019) is: 13.8%    Assessment & Plan:   #1 type 2 diabetes slightly improved with A1c 6.7%.  Continue metformin .  Continue weight control efforts.  We discussed diet in some detail.  Made some suggestions for lowering overall carbohydrate intake.  Reassess in 6 months.  Recommend yearly diabetic eye exam and check urine microalbumin at follow-up  #2 dyslipidemia.  Patient on rosuvastatin  and recently switched to Lovaza .  Recheck fasting lipid today along with hepatic panel  #3 family history of CAD.  We have  discussed and recommend he consider coronary calcium  score.  Handout given.  He will let us  know if interested.  Discussed Mediterranean type diet in some detail today.   Return in about 6 months (around 07/14/2024).  Glean Lamy, MD

## 2024-02-25 NOTE — Progress Notes (Signed)
 [facility] Quality Team Note  Name: Andrew Shields Date of Birth: 12/28/1965 MRN: [id number] Date: 02/25/2024  [facility] Quality Team has reviewed this patient's chart, please see recommendations below:  [facility] Quality Other; (CHART REVIEWED. ABSTRACTED COMPLIANT A1C, NO LABS COMPLETED FOR KIDNEY HEALTH EVALUATION. PATIENT NEEDS URINE MICROALBUMIN/CREATININE RATIO AND EGFR FOR GAP CLOSURE)

## 2024-04-13 ENCOUNTER — Other Ambulatory Visit: Payer: Self-pay

## 2024-04-13 MED ORDER — PANTOPRAZOLE SODIUM 40 MG PO TBEC
40.0000 mg | DELAYED_RELEASE_TABLET | Freq: Every day | ORAL | 1 refills | Status: DC
Start: 2024-04-13 — End: 2024-07-15

## 2024-05-12 ENCOUNTER — Ambulatory Visit: Payer: Self-pay

## 2024-05-12 NOTE — Telephone Encounter (Signed)
 FYI Only or Action Required?: FYI only for provider.  Patient was last seen in primary care on 01/12/2024 by [name] ORN, MD.  Called Nurse Triage reporting Abdominal Pain.  Symptoms began several days ago.  Interventions attempted: Rest, hydration, or home remedies.  Symptoms are: unchanged.  Triage Disposition: See Physician Within 24 Hours  Patient/caregiver understands and will follow disposition?: Yes Reason for Disposition  [1] MODERATE pain (e.g., interferes with normal activities) AND [2] pain comes and goes (cramps) AND [3] present > 24 hours  (Exception: Pain with Vomiting or Diarrhea - see that Guideline.)  Answer Assessment - Initial Assessment Questions Patient was hesitant to schedule with anyone except for PCP. This RN advised that it is important he be seen and that a message will be sent to Dr. Micheal to make him aware that he is being scheduled with an available provider within the same office. Patient agreed and was scheduled.  Patient reports 3 day hx of burning sensation from belly button to the right lower abdomen. Also reports feeling a small bulge at his belly button. Denies a pulsing sensation. States burning is mild and worse when he pushes on the bulge.   States several years ago, he saw a urologist for testicle pain that has been intermittent and sore. Denies change in that discomfort. States occasionally has intermittent right thigh pain x 2-3 months. Denies change in that discomfort.  Denies new onset back pain. Reports that he occassionally has to sit on the floor d/t dizziness and that he is diaphoretic when it occurs. States has been happening for years.  This RN reviewed 911/ED precautions including: Worsening pain/burning, nausea, vomiting, fever, a pulsing sensation in the abdomen, a steady abdominal or back pain or new/changing pelvic or groin pain or a new/changing leg pain, onset of dizziness or lightheadedness, sweating. Patient verbalized  understanding.  1. LOCATION:      Umbilicus to RLQ burning  3. ONSET: When did the pain begin? (Minutes, hours or days ago)      3 days  4. SUDDEN: Gradual or sudden onset?     Gradual and intermittent  5. PATTERN Does the pain come and go, or is it constant?     Comes and goes  6. SEVERITY: How bad is the pain?  (e.g., Scale 1-10; mild, moderate, or severe)     Mild  7. RECURRENT SYMPTOM: Have you ever had this type of stomach pain before? If Yes, ask: When was the last time? and What happened that time?      Denies  8. CAUSE: What do you think is causing the stomach pain? (e.g., gallstones, recent abdominal surgery)     Unknown  Protocols used: Abdominal Pain - Male-A-AH Copied from CRM #[id number]. Topic: Clinical - Red Word Triage >> May 12, 2024  4:28 PM Dedra B wrote: Red Word that prompted transfer to Nurse Triage: Pt is having soreness around belly button and thinks it could be a hernia. Area above belly button hurts when pressed. Warm transfer to nurse triage.

## 2024-05-13 ENCOUNTER — Encounter: Payer: Self-pay | Admitting: Internal Medicine

## 2024-05-13 ENCOUNTER — Ambulatory Visit: Admitting: Internal Medicine

## 2024-05-13 VITALS — BP 120/84 | HR 76 | Temp 98.0°F | Wt 223.9 lb

## 2024-05-13 DIAGNOSIS — K429 Umbilical hernia without obstruction or gangrene: Secondary | ICD-10-CM

## 2024-05-13 NOTE — Progress Notes (Signed)
 Established Patient Office Visit     CC/Reason for Visit: Protruding mass above my bellybutton, feels like a spring  HPI: Andrew Shields is a 58 y.o. male who is coming in today for the above mentioned reasons.  A few days ago started noticing a small protuberant area above his bellybutton that he could easily put shin that he describes like a spring.  No pain, nausea, vomiting urinary issues or constipation.   Past Medical/Surgical History: Past Medical History:  Diagnosis Date   Hyperlipidemia     Past Surgical History:  Procedure Laterality Date   CARPAL TUNNEL RELEASE Right    COLONOSCOPY  04/19/2010   RMR: 1. normal rectum 2. normal colon. 3. Normal terminal ileum    COLONOSCOPY N/A 06/06/2015   Dr. Charley. Surveillance in 5 years    COLONOSCOPY WITH PROPOFOL  N/A 10/13/2020   Procedure: COLONOSCOPY WITH PROPOFOL ;  Surgeon: Shaaron Lamar HERO, MD;  [facility];  Service: Endoscopy;  Laterality: N/A;  11:00am, covid + 1/18 <90 days   ESOPHAGEAL DILATION N/A 06/06/2015   Procedure: ESOPHAGEAL DILATION;  Surgeon: Lamar HERO Shaaron, MD;  [facility];  Service: Endoscopy;  Laterality: N/A;   ESOPHAGOGASTRODUODENOSCOPY N/A 06/06/2015   Dr. Rourk:mild erosive reflux/HH/s/p empiric dilation   TONSILLECTOMY      Social History:  reports that he has never smoked. He has never used smokeless tobacco. He reports that he does not drink alcohol and does not use drugs.  Allergies: Allergies  Allergen Reactions   Amoxicillin-Pot Clavulanate Shortness Of Breath and Other (See Comments)    difficulty breathing    Family History:  Family History  Problem Relation Age of Onset   Cancer Mother        colon cancer   Colon cancer Mother        age late 81s   Heart disease Mother 50       stent   Diabetes Mother        type ll   Alcohol abuse Father    Cancer Father 61       lung   Diabetes Sister        ?type 2   Hyperlipidemia Brother       Current Outpatient Medications:    Cyanocobalamin (B-12) 5000 MCG SUBL, Place 1 mL under the tongue daily., Disp: , Rfl:    fluticasone  (FLONASE ) 50 MCG/ACT nasal spray, Place 2 sprays into both nostrils daily., Disp: 16 g, Rfl: 10   metFORMIN  (GLUCOPHAGE ) 500 MG tablet, TAKE 1 TABLET BY MOUTH 2 TIMES A DAY WITH A MEAL., Disp: 180 tablet, Rfl: 3   omega-3 acid ethyl esters (LOVAZA ) 1 g capsule, Take 2 capsules by mouth twice daily, Disp: 360 capsule, Rfl: 3   pantoprazole  (PROTONIX ) 40 MG tablet, Take 1 tablet (40 mg total) by mouth daily., Disp: 90 tablet, Rfl: 1   rosuvastatin  (CRESTOR ) 10 MG tablet, Take 1 tablet (10 mg total) by mouth daily., Disp: 90 tablet, Rfl: 3  Review of Systems:  Negative unless indicated in HPI.   Physical Exam: Vitals:   05/13/24 1322  BP: 120/84  Pulse: 76  Temp: 98 F (36.7 C)  TempSrc: Oral  SpO2: 98%  Weight: 223 lb 14.4 oz (101.6 kg)    Body mass index is 33.06 kg/m.   Physical Exam Abdominal:      Comments: Small, protuberant mass above his bellybutton that is easily reducible.      Impression and  Plan:  Umbilical hernia without obstruction and without gangrene  -Advised observation for now, can consider referral to general surgery in future if necessary.   Time spent:22 minutes reviewing chart, interviewing and examining patient and formulating plan of care.     Andrew Theophilus Andrews, MD  [facility] at [facility]

## 2024-05-14 ENCOUNTER — Emergency Department (HOSPITAL_COMMUNITY)

## 2024-05-14 ENCOUNTER — Other Ambulatory Visit: Payer: Self-pay

## 2024-05-14 ENCOUNTER — Emergency Department (HOSPITAL_COMMUNITY)
Admission: EM | Admit: 2024-05-14 | Discharge: 2024-05-14 | Disposition: A | Discharge status: Home / Self Care | Attending: Emergency Medicine | Admitting: Emergency Medicine

## 2024-05-14 ENCOUNTER — Encounter (HOSPITAL_COMMUNITY): Payer: Self-pay

## 2024-05-14 DIAGNOSIS — R1011 Right upper quadrant pain: Secondary | ICD-10-CM | POA: Diagnosis not present

## 2024-05-14 DIAGNOSIS — R1084 Generalized abdominal pain: Secondary | ICD-10-CM | POA: Diagnosis not present

## 2024-05-14 DIAGNOSIS — R1031 Right lower quadrant pain: Secondary | ICD-10-CM | POA: Diagnosis not present

## 2024-05-14 DIAGNOSIS — Z79899 Other long term (current) drug therapy: Secondary | ICD-10-CM | POA: Diagnosis not present

## 2024-05-14 DIAGNOSIS — R110 Nausea: Secondary | ICD-10-CM | POA: Diagnosis not present

## 2024-05-14 DIAGNOSIS — R1033 Periumbilical pain: Secondary | ICD-10-CM | POA: Insufficient documentation

## 2024-05-14 DIAGNOSIS — K429 Umbilical hernia without obstruction or gangrene: Secondary | ICD-10-CM | POA: Diagnosis not present

## 2024-05-14 DIAGNOSIS — Z7984 Long term (current) use of oral hypoglycemic drugs: Secondary | ICD-10-CM | POA: Diagnosis not present

## 2024-05-14 DIAGNOSIS — N200 Calculus of kidney: Secondary | ICD-10-CM | POA: Diagnosis not present

## 2024-05-14 LAB — COMPREHENSIVE METABOLIC PANEL WITH GFR
ALT: 25 U/L (ref 0–44)
AST: 27 U/L (ref 15–41)
Albumin: 4.5 g/dL (ref 3.5–5.0)
Alkaline Phosphatase: 58 U/L (ref 38–126)
Anion gap: 11 (ref 5–15)
BUN: 14 mg/dL (ref 6–20)
CO2: 22 mmol/L (ref 22–32)
Calcium: 9.1 mg/dL (ref 8.9–10.3)
Chloride: 105 mmol/L (ref 98–111)
Creatinine, Ser: 0.84 mg/dL (ref 0.61–1.24)
GFR, Estimated: >60 mL/min
Glucose, Bld: 116 mg/dL — ABNORMAL HIGH (ref 70–99)
Potassium: 4.1 mmol/L (ref 3.5–5.1)
Sodium: 138 mmol/L (ref 135–145)
Total Bilirubin: 1 mg/dL (ref 0.0–1.2)
Total Protein: 7.4 g/dL (ref 6.5–8.1)

## 2024-05-14 LAB — URINALYSIS, ROUTINE W REFLEX MICROSCOPIC
Bacteria, UA: NONE SEEN
Bilirubin Urine: NEGATIVE
Glucose, UA: NEGATIVE mg/dL
Ketones, ur: NEGATIVE mg/dL
Leukocytes,Ua: NEGATIVE
Nitrite: NEGATIVE
Protein, ur: NEGATIVE mg/dL
Specific Gravity, Urine: 1.017 (ref 1.005–1.030)
pH: 6 (ref 5.0–8.0)

## 2024-05-14 LAB — CBC
HCT: 45.1 % (ref 39.0–52.0)
Hemoglobin: 15 g/dL (ref 13.0–17.0)
MCH: 30.9 pg (ref 26.0–34.0)
MCHC: 33.3 g/dL (ref 30.0–36.0)
MCV: 92.8 fL (ref 80.0–100.0)
Platelets: 238 10*3/uL (ref 150–400)
RBC: 4.86 MIL/uL (ref 4.22–5.81)
RDW: 12.7 % (ref 11.5–15.5)
WBC: 6.7 10*3/uL (ref 4.0–10.5)
nRBC: 0 % (ref 0.0–0.2)

## 2024-05-14 LAB — LIPASE, BLOOD: Lipase: 40 U/L (ref 11–51)

## 2024-05-14 MED ORDER — MORPHINE SULFATE (PF) 4 MG/ML IV SOLN
4.0000 mg | Freq: Once | INTRAVENOUS | Status: AC
  Administered 2024-05-14: 4 mg via INTRAVENOUS
  Filled 2024-05-14: qty 1

## 2024-05-14 MED ORDER — ONDANSETRON 4 MG PO TBDP: 4.0000 mg | ORAL_TABLET | Freq: Three times a day (TID) | ORAL | 0 refills | Status: DC | PRN

## 2024-05-14 MED ORDER — IOHEXOL 300 MG/ML  SOLN
100.0000 mL | Freq: Once | INTRAMUSCULAR | Status: AC | PRN
  Administered 2024-05-14: 100 mL via INTRAVENOUS

## 2024-05-14 MED ORDER — HYDROCODONE-ACETAMINOPHEN 5-325 MG PO TABS: 1.0000 | ORAL_TABLET | ORAL | 0 refills | Status: DC | PRN

## 2024-05-14 MED ORDER — ONDANSETRON HCL 4 MG/2ML IJ SOLN
4.0000 mg | Freq: Once | INTRAMUSCULAR | Status: AC
  Administered 2024-05-14: 4 mg via INTRAVENOUS
  Filled 2024-05-14: qty 2

## 2024-05-14 NOTE — Discharge Instructions (Addendum)
 Your CT scan is unremarkable and labs reassuring. No concerning cause for your abdominal pain is identified.   You have an umbilical hernia as we discussed. You can follow up with general surgery to discussed elective repair of the hernia as convenient. Take Norco for pain and Zofran  for nausea if needed.  If you develop severe pain, develop a fever, vomiting or new concern, return to the emergency department for further evaluation.

## 2024-05-14 NOTE — ED Triage Notes (Signed)
 Pt stated that he has had burning in his umbilical area that radiates down and to the right side. This started a week ago. Pt stated that he was recently diagnosed with an umbilical hernia as well.

## 2024-05-14 NOTE — ED Provider Notes (Signed)
  Physical Exam  BP 112/79   Pulse 70   Temp 98.5 F (36.9 C) (Oral)   Resp 16   Ht 5' 9 (1.753 m)   Wt 101.6 kg   SpO2 95%   BMI 33.08 kg/m   Physical Exam  Procedures  Procedures  ED Course / MDM    Medical Decision Making Amount and/or Complexity of Data Reviewed Labs: ordered. Radiology: ordered.  Risk Prescription drug management.   Periumbilical and right AP x 1 week PCP concerned for hernia Has hernia but is easily reducible  Pending CT, labs unremarkable  Plan: if CT negative, follow up surgery for elective treatment of umbilical hernia.   19:15 - introduced myself to the patient and family. No needs at this time.   CT results per radiology:  IMPRESSION: No acute findings in the abdomen or pelvis.   Normal appendix.   Punctate right nephrolithiasis.  No hydronephrosis.    19:25 - CT reviewed. He can be discharged per plan of previous treatment team, referral to surgery for hernia repair.        Andrew Balls, PA-C 05/14/24 1928    Andrew Ozell BROCKS, MD 05/15/24 [id number]

## 2024-05-14 NOTE — ED Provider Notes (Signed)
  EMERGENCY DEPARTMENT AT [facility] Provider Note   CSN: [id number] Arrival date & time: 05/14/24  1538     Patient presents with: Abdominal Pain   Andrew Shields is a 58 y.o. male.   Patient is a 58 year old male who presents to the emergency department with generalized abdominal pain which is worse in the periumbilical region and right side.  He notes that the symptoms have been ongoing for approximate the past week.  He notes that he has had no dysuria, hematuria, constipation, diarrhea.  He has had some associated nausea without vomiting.  He has had no recent falls or blunt abdominal wall trauma.  He was evaluated by his primary care doctor yesterday who diagnosed him with an umbilical hernia.  He denies any associated chest pain or shortness of breath.  He denies any history of abdominal surgeries.   Abdominal Pain      Prior to Admission medications   Medication Sig Start Date End Date Taking? Authorizing Provider  Cyanocobalamin (B-12) 5000 MCG SUBL Place 1 mL under the tongue daily.    [provider]  fluticasone  (FLONASE ) 50 MCG/ACT nasal spray Place 2 sprays into both nostrils daily. 11/06/23 05/13/24  Karis Clunes, MD  metFORMIN  (GLUCOPHAGE ) 500 MG tablet TAKE 1 TABLET BY MOUTH 2 TIMES A DAY WITH A MEAL. 07/14/23   Burchette, Wolm ORN, MD  omega-3 acid ethyl esters (LOVAZA ) 1 g capsule Take 2 capsules by mouth twice daily 07/15/23   Burchette, Wolm ORN, MD  pantoprazole  (PROTONIX ) 40 MG tablet Take 1 tablet (40 mg total) by mouth daily. 04/13/24   Burchette, Wolm ORN, MD  rosuvastatin  (CRESTOR ) 10 MG tablet Take 1 tablet (10 mg total) by mouth daily. 07/14/23   Burchette, Wolm ORN, MD    Allergies: Amoxicillin-pot clavulanate    Review of Systems  Gastrointestinal:  Positive for abdominal pain.  All other systems reviewed and are negative.   Updated Vital Signs BP 112/79   Pulse 70   Temp 98.5 F (36.9 C) (Oral)   Resp 16   Ht 5' 9 (1.753 m)    Wt 101.6 kg   SpO2 95%   BMI 33.08 kg/m   Physical Exam Vitals and nursing note reviewed.  Constitutional:      General: He is not in acute distress.    Appearance: Normal appearance. He is not ill-appearing.  HENT:     Head: Normocephalic and atraumatic.     Nose: Nose normal.     Mouth/Throat:     Mouth: Mucous membranes are moist.  Eyes:     Extraocular Movements: Extraocular movements intact.     Conjunctiva/sclera: Conjunctivae normal.     Pupils: Pupils are equal, round, and reactive to light.  Cardiovascular:     Rate and Rhythm: Normal rate and regular rhythm.     Pulses: Normal pulses.     Heart sounds: Normal heart sounds. No murmur heard.    No gallop.  Pulmonary:     Effort: Pulmonary effort is normal. No respiratory distress.     Breath sounds: Normal breath sounds. No stridor. No wheezing, rhonchi or rales.  Abdominal:     General: Abdomen is flat. Bowel sounds are normal. There is no distension. There are no signs of injury.     Palpations: Abdomen is soft.     Tenderness: There is abdominal tenderness in the right upper quadrant, right lower quadrant and periumbilical area.     Hernia: No hernia is  present.  Musculoskeletal:        General: Normal range of motion.     Cervical back: Normal range of motion and neck supple.  Skin:    General: Skin is warm and dry.  Neurological:     General: No focal deficit present.     Mental Status: He is alert and oriented to person, place, and time. Mental status is at baseline.     Cranial Nerves: No cranial nerve deficit.     Motor: No weakness.  Psychiatric:        Mood and Affect: Mood normal.        Behavior: Behavior normal.        Thought Content: Thought content normal.        Judgment: Judgment normal.     (all labs ordered are listed, but only abnormal results are displayed) Labs Reviewed  LIPASE, BLOOD  COMPREHENSIVE METABOLIC PANEL WITH GFR  CBC  URINALYSIS, ROUTINE W REFLEX MICROSCOPIC     EKG: None  Radiology: No results found.   Procedures   Medications Ordered in the ED  morphine  (PF) 4 MG/ML injection 4 mg (4 mg Intravenous Given 05/14/24 1730)  ondansetron  (ZOFRAN ) injection 4 mg (4 mg Intravenous Given 05/14/24 1730)                                    Medical Decision Making Amount and/or Complexity of Data Reviewed Labs: ordered. Radiology: ordered.  Risk Prescription drug management.   This patient presents to the ED for concern of abdominal pain differential diagnosis includes acute appendicitis, cholecystitis close obstruction, diverticulitis, testicular torsion [place] nephritis, kidney stone, pancreatitis, mesenteric ischemia    Additional history obtained:  Additional history obtained from none External records from outside source obtained and reviewed including none   Lab Tests:  I Ordered, and personally interpreted labs.  The pertinent results include: No leukocytosis, no anemia, normal kidney function liver function, normal electrolytes, negative lipase, urinalysis with small hemoglobin   Imaging Studies ordered:  I ordered imaging studies including CT scan abdomen pelvis I independently visualized and interpreted imaging which showed pending at shift change I agree with the radiologist interpretation   Medicines ordered and prescription drug management:  I ordered medication including morphine , Zofran  for abdominal pain Reevaluation of the patient after these medicines showed that the patient improved I have reviewed the patients home medicines and have made adjustments as needed   Problem List / ED Course:  Patient does remain stable at this time.  He was provided with a medication to help with abdominal pain in the emergency department.  Blood work has been overall unremarkable at this point.  Will obtain CT scan of the abdomen and pelvis for further evaluation of his abdominal pain.  He does have an umbilical hernia  noted on exam which is easily reducible.  Low suspicion for strangulated or incarcerated hernia at this point.  Will sign patient out to Margit Paris, PA-C pending CT results and final dispo.   Social Determinants of Health:  None        Final diagnoses:  None    ED Discharge Orders     None          Andrew Shields 05/14/24 1856    Andrew Ozell BROCKS, MD 05/15/24 [phone]

## 2024-05-20 ENCOUNTER — Ambulatory Visit (INDEPENDENT_AMBULATORY_CARE_PROVIDER_SITE_OTHER): Admitting: General Surgery

## 2024-05-20 ENCOUNTER — Encounter: Payer: Self-pay | Admitting: General Surgery

## 2024-05-20 VITALS — BP 122/80 | HR 65 | Temp 98.0°F | Resp 12 | Ht 69.0 in | Wt 222.0 lb

## 2024-05-20 DIAGNOSIS — K429 Umbilical hernia without obstruction or gangrene: Secondary | ICD-10-CM | POA: Diagnosis not present

## 2024-05-20 NOTE — H&P (Signed)
 Andrew Shields; [id number]; [birth year]   HPI Patient is a 58 year old white male who was referred by care by the emergency room and Dr. Micheal for evaluation and treatment of an umbilical hernia.  Patient started having umbilical pain approximately 1 month ago.  He has noticed a lump at the umbilicus which he is able to reduce.  It is causing him a stinging sensation extending up from the umbilicus.  No nausea or vomiting have been noted.  He was seen in the emergency room recently for this.  He was diagnosed with an umbilical hernia. Past Medical History:  Diagnosis Date   Hyperlipidemia     Past Surgical History:  Procedure Laterality Date   CARPAL TUNNEL RELEASE Right    COLONOSCOPY  04/19/2010   RMR: 1. normal rectum 2. normal colon. 3. Normal terminal ileum    COLONOSCOPY N/A 06/06/2015   Dr. Charley. Surveillance in 5 years    COLONOSCOPY WITH PROPOFOL  N/A 10/13/2020   Procedure: COLONOSCOPY WITH PROPOFOL ;  Surgeon: Shaaron Lamar HERO, MD;  Location: [facility];  Service: Endoscopy;  Laterality: N/A;  11:00am, covid + 1/18 <90 days   ESOPHAGEAL DILATION N/A 06/06/2015   Procedure: ESOPHAGEAL DILATION;  Surgeon: Lamar HERO Shaaron, MD;  Location: [facility];  Service: Endoscopy;  Laterality: N/A;   ESOPHAGOGASTRODUODENOSCOPY N/A 06/06/2015   Dr. Rourk:mild erosive reflux/HH/s/p empiric dilation   TONSILLECTOMY      Family History  Problem Relation Age of Onset   Cancer Mother        colon cancer   Colon cancer Mother        age late 37s   Heart disease Mother 25       stent   Diabetes Mother        type ll   Alcohol abuse Father    Cancer Father 44       lung   Diabetes Sister        ?type 2   Hyperlipidemia Brother     Current Outpatient Medications on File Prior to Visit  Medication Sig Dispense Refill   Cyanocobalamin (B-12) 5000 MCG SUBL Place 1 mL under the tongue daily.     fluticasone  (FLONASE ) 50 MCG/ACT nasal spray Place 2 sprays into both nostrils  daily. 16 g 10   metFORMIN  (GLUCOPHAGE ) 500 MG tablet TAKE 1 TABLET BY MOUTH 2 TIMES A DAY WITH A MEAL. 180 tablet 3   omega-3 acid ethyl esters (LOVAZA ) 1 g capsule Take 2 capsules by mouth twice daily 360 capsule 3   ondansetron  (ZOFRAN -ODT) 4 MG disintegrating tablet Take 1 tablet (4 mg total) by mouth every 8 (eight) hours as needed for nausea or vomiting. 10 tablet 0   pantoprazole  (PROTONIX ) 40 MG tablet Take 1 tablet (40 mg total) by mouth daily. 90 tablet 1   rosuvastatin  (CRESTOR ) 10 MG tablet Take 1 tablet (10 mg total) by mouth daily. 90 tablet 3   HYDROcodone -acetaminophen  (NORCO/VICODIN) 5-325 MG tablet Take 1-2 tablets by mouth every 4 (four) hours as needed. (Patient not taking: Reported on 05/20/2024) 12 tablet 0   No current facility-administered medications on file prior to visit.    Allergies  Allergen Reactions   Amoxicillin-Pot Clavulanate Shortness Of Breath and Other (See Comments)    difficulty breathing    Social History   Substance and Sexual Activity  Alcohol Use No   Alcohol/week: 0.0 standard drinks of alcohol    Social History   Tobacco Use  Smoking Status Never  Smokeless Tobacco Never    Review of Systems  Constitutional: Negative.   HENT: Negative.    Eyes: Negative.   Respiratory: Negative.    Cardiovascular: Negative.   Gastrointestinal:  Positive for abdominal pain.  Genitourinary: Negative.   Musculoskeletal: Negative.   Skin: Negative.   Neurological: Negative.   Endo/Heme/Allergies: Negative.   Psychiatric/Behavioral: Negative.      Objective   Vitals:   05/20/24 0904  BP: 122/80  Pulse: 65  Resp: 12  Temp: 98 F (36.7 C)  SpO2: 97%    Physical Exam Vitals reviewed.  Constitutional:      Appearance: Normal appearance. He is not ill-appearing.  HENT:     Head: Normocephalic and atraumatic.  Cardiovascular:     Rate and Rhythm: Normal rate and regular rhythm.     Heart sounds: Normal heart sounds. No murmur heard.     No friction rub. No gallop.  Pulmonary:     Effort: Pulmonary effort is normal. No respiratory distress.     Breath sounds: Normal breath sounds. No stridor. No wheezing, rhonchi or rales.  Abdominal:     General: Bowel sounds are normal. There is no distension.     Palpations: Abdomen is soft. There is no mass.     Tenderness: There is no abdominal tenderness. There is no guarding or rebound.     Hernia: A hernia is present.     Comments: A 2 cm reducible supraumbilical hernia is present.  Diastases recti is present.  Skin:    General: Skin is warm and dry.  Neurological:     Mental Status: He is alert and oriented to person, place, and time.   ER notes reviewed Primary care notes reviewed CT scan images personally reviewed  Assessment  Umbilical hernia  Plan  Patient is scheduled for robotic assisted laparoscopic umbilical herniorrhaphy with mesh on 05/24/2024.  The risks and benefits of the procedure including bleeding, infection, mesh use, and the possibility of an open procedure were fully explained to the patient, who gave informed consent.

## 2024-05-20 NOTE — Progress Notes (Signed)
 Andrew Shields; [id number]; [birth year]   HPI Patient is a 58 year old white male who was referred by care by the emergency room and Dr. Micheal for evaluation and treatment of an umbilical hernia.  Patient started having umbilical pain approximately 1 month ago.  He has noticed a lump at the umbilicus which he is able to reduce.  It is causing him a stinging sensation extending up from the umbilicus.  No nausea or vomiting have been noted.  He was seen in the emergency room recently for this.  He was diagnosed with an umbilical hernia. Past Medical History:  Diagnosis Date   Hyperlipidemia     Past Surgical History:  Procedure Laterality Date   CARPAL TUNNEL RELEASE Right    COLONOSCOPY  04/19/2010   RMR: 1. normal rectum 2. normal colon. 3. Normal terminal ileum    COLONOSCOPY N/A 06/06/2015   Dr. Charley. Surveillance in 5 years    COLONOSCOPY WITH PROPOFOL  N/A 10/13/2020   Procedure: COLONOSCOPY WITH PROPOFOL ;  Surgeon: Shaaron Lamar HERO, MD;  Location: [facility];  Service: Endoscopy;  Laterality: N/A;  11:00am, covid + 1/18 <90 days   ESOPHAGEAL DILATION N/A 06/06/2015   Procedure: ESOPHAGEAL DILATION;  Surgeon: Lamar HERO Shaaron, MD;  Location: [facility];  Service: Endoscopy;  Laterality: N/A;   ESOPHAGOGASTRODUODENOSCOPY N/A 06/06/2015   Dr. Rourk:mild erosive reflux/HH/s/p empiric dilation   TONSILLECTOMY      Family History  Problem Relation Age of Onset   Cancer Mother        colon cancer   Colon cancer Mother        age late 37s   Heart disease Mother 25       stent   Diabetes Mother        type ll   Alcohol abuse Father    Cancer Father 44       lung   Diabetes Sister        ?type 2   Hyperlipidemia Brother     Current Outpatient Medications on File Prior to Visit  Medication Sig Dispense Refill   Cyanocobalamin (B-12) 5000 MCG SUBL Place 1 mL under the tongue daily.     fluticasone  (FLONASE ) 50 MCG/ACT nasal spray Place 2 sprays into both nostrils  daily. 16 g 10   metFORMIN  (GLUCOPHAGE ) 500 MG tablet TAKE 1 TABLET BY MOUTH 2 TIMES A DAY WITH A MEAL. 180 tablet 3   omega-3 acid ethyl esters (LOVAZA ) 1 g capsule Take 2 capsules by mouth twice daily 360 capsule 3   ondansetron  (ZOFRAN -ODT) 4 MG disintegrating tablet Take 1 tablet (4 mg total) by mouth every 8 (eight) hours as needed for nausea or vomiting. 10 tablet 0   pantoprazole  (PROTONIX ) 40 MG tablet Take 1 tablet (40 mg total) by mouth daily. 90 tablet 1   rosuvastatin  (CRESTOR ) 10 MG tablet Take 1 tablet (10 mg total) by mouth daily. 90 tablet 3   HYDROcodone -acetaminophen  (NORCO/VICODIN) 5-325 MG tablet Take 1-2 tablets by mouth every 4 (four) hours as needed. (Patient not taking: Reported on 05/20/2024) 12 tablet 0   No current facility-administered medications on file prior to visit.    Allergies  Allergen Reactions   Amoxicillin-Pot Clavulanate Shortness Of Breath and Other (See Comments)    difficulty breathing    Social History   Substance and Sexual Activity  Alcohol Use No   Alcohol/week: 0.0 standard drinks of alcohol    Social History   Tobacco Use  Smoking Status Never  Smokeless Tobacco Never    Review of Systems  Constitutional: Negative.   HENT: Negative.    Eyes: Negative.   Respiratory: Negative.    Cardiovascular: Negative.   Gastrointestinal:  Positive for abdominal pain.  Genitourinary: Negative.   Musculoskeletal: Negative.   Skin: Negative.   Neurological: Negative.   Endo/Heme/Allergies: Negative.   Psychiatric/Behavioral: Negative.      Objective   Vitals:   05/20/24 0904  BP: 122/80  Pulse: 65  Resp: 12  Temp: 98 F (36.7 C)  SpO2: 97%    Physical Exam Vitals reviewed.  Constitutional:      Appearance: Normal appearance. He is not ill-appearing.  HENT:     Head: Normocephalic and atraumatic.  Cardiovascular:     Rate and Rhythm: Normal rate and regular rhythm.     Heart sounds: Normal heart sounds. No murmur heard.     No friction rub. No gallop.  Pulmonary:     Effort: Pulmonary effort is normal. No respiratory distress.     Breath sounds: Normal breath sounds. No stridor. No wheezing, rhonchi or rales.  Abdominal:     General: Bowel sounds are normal. There is no distension.     Palpations: Abdomen is soft. There is no mass.     Tenderness: There is no abdominal tenderness. There is no guarding or rebound.     Hernia: A hernia is present.     Comments: A 2 cm reducible supraumbilical hernia is present.  Diastases recti is present.  Skin:    General: Skin is warm and dry.  Neurological:     Mental Status: He is alert and oriented to person, place, and time.   ER notes reviewed Primary care notes reviewed CT scan images personally reviewed  Assessment  Umbilical hernia  Plan  Patient is scheduled for robotic assisted laparoscopic umbilical herniorrhaphy with mesh on 05/24/2024.  The risks and benefits of the procedure including bleeding, infection, mesh use, and the possibility of an open procedure were fully explained to the patient, who gave informed consent.

## 2024-05-20 NOTE — Patient Instructions (Signed)
 Andrew Shields  05/20/2024     @PREFPERIOPPHARMACY @   Your procedure is scheduled on  05/24/2024.   Report to [facility] at  0915  A.M.   Call this number if you have problems the morning of surgery:  [phone]  If you experience any cold or flu symptoms such as cough, fever, chills, shortness of breath, etc. between now and your scheduled surgery, please notify us  at the above number.   Remember:          DO NOT take any medications for diabetes the morning of your procedure.   Do not eat after midnight.   You may drink clear liquids until 0715 am on 05/24/2024.      Clear liquids allowed are:                    Water , Juice (No red color; non-citric and without pulp; diabetics please choose diet or no sugar options), Carbonated beverages (diabetics please choose diet or no sugar options), Clear Tea (No creamer, milk, or cream, including half & half and powdered creamer), Black Coffee Only (No creamer, milk or cream, including half & half and powdered creamer), and Clear Sports drink (No red color; diabetics please choose diet or no sugar options)    Take these medicines the morning of surgery with A SIP OF WATER                                               pantoprazole .    Do not wear jewelry, make-up or nail polish, including gel polish,  artificial nails, or any other type of covering on natural nails (fingers and  toes).  Do not wear lotions, powders, or perfumes, or deodorant.  Do not shave 48 hours prior to surgery.  Men may shave face and neck.  Do not bring valuables to the hospital.  [facility] is not responsible for any belongings or valuables.  Contacts, dentures or bridgework may not be worn into surgery.  Leave your suitcase in the car.  After surgery it may be brought to your room.  For patients admitted to the hospital, discharge time will be determined by your treatment team.  Patients discharged the day of surgery will not be allowed to drive  home and must have someone with them for 24 hours.    Special instructions:  DO NOT smoke tobacco or vape for 24 hours before your procedure.  Please read over the following fact sheets that you were given. Coughing and Deep Breathing, Surgical Site Infection Prevention, Anesthesia Post-op Instructions, and Care and Recovery After Surgery        Laparoscopic Surgery for Belly Hernias: What to Know After After the procedure, it's common to have pain, discomfort, or soreness. Follow these instructions at home: Medicines Take your medicines only as told. You may need to take steps to help treat or prevent trouble pooping (constipation), such as: Taking medicines to help you poop. Eating foods high in fiber, like beans, whole grains, and fresh fruits and vegetables. Drinking more fluids as told. Ask your health care provider if it's safe to drive or use machines while taking your medicine. Incision care  Take care of the cuts in your belly as told. Make sure you: Wash your hands with soap and water  for at least 20 seconds before  and after you change your bandage. If you can't use soap and water , use hand sanitizer. Change your bandage. Leave stitches or skin glue alone. Leave tape strips alone unless you're told to take them off. You may trim the edges of the tape strips if they curl up. Check the cuts on your belly every day for signs of infection. Check for: More redness, swelling, or pain. More fluid or blood. Warmth. Pus or a bad smell. Activity Rest as told. Get up to take short walks at least every 2 hours during the day. This helps you breathe better and keeps your blood flowing. Ask for help if you feel weak or unsteady. Do not take baths, swim, or use a hot tub until you're told it's OK. Ask if you can shower. Ask if it's OK for you to lift. If you were given a sedative, do not drive or use machines until you're told it's safe. A sedative can make you sleepy. Ask what  things are safe for you to do at home. Ask when you can go back to work or school. General instructions Hold a pillow over your belly when you cough or sneeze. This helps with pain. Wear a binder around your belly as told by your provider. Do not smoke, vape, or use nicotine or tobacco. Wear compression stockings to reduce swelling and help prevent blood clots in your legs. You may be asked to continue to do deep breathing exercises at home. This will help to prevent a lung infection. Contact a health care provider if: You have any signs of infection. You have pain that gets worse or does not get better with medicine. You throw up or you feel like throwing up. You have a cough. You have not pooped in 3 days. You are not able to pee. You have a fever. Get help right away if: You have very bad pain in your belly. You throw up every time you eat or drink. You have redness, warmth, or pain in your leg. You have chest pain. You have trouble breathing. These symptoms may be an emergency. Call 911 right away. Do not wait to see if the symptoms will go away. Do not drive yourself to the hospital. This information is not intended to replace advice given to you by your health care provider. Make sure you discuss any questions you have with your health care provider. Document Revised: 02/18/2023 Document Reviewed: 02/18/2023 Elsevier Patient Education  2024 [employer]General Anesthesia, Adult, Care After The following information offers guidance on how to care for yourself after your procedure. Your health care provider may also give you more specific instructions. If you have problems or questions, contact your health care provider. What can I expect after the procedure? After the procedure, it is common for people to: Have pain or discomfort at the IV site. Have nausea or vomiting. Have a sore throat or hoarseness. Have trouble concentrating. Feel cold or chills. Feel weak, sleepy, or  tired (fatigue). Have soreness and body aches. These can affect parts of the body that were not involved in surgery. Follow these instructions at home: For the time period you were told by your health care provider:  Rest. Do not participate in activities where you could fall or become injured. Do not drive or use machinery. Do not drink alcohol. Do not take sleeping pills or medicines that cause drowsiness. Do not make important decisions or sign legal documents. Do not take care of children on your own. General  instructions Drink enough fluid to keep your urine pale yellow. If you have sleep apnea, surgery and certain medicines can increase your risk for breathing problems. Follow instructions from your health care provider about wearing your sleep device: Anytime you are sleeping, including during daytime naps. While taking prescription pain medicines, sleeping medicines, or medicines that make you drowsy. Return to your normal activities as told by your health care provider. Ask your health care provider what activities are safe for you. Take over-the-counter and prescription medicines only as told by your health care provider. Do not use any products that contain nicotine or tobacco. These products include cigarettes, chewing tobacco, and vaping devices, such as e-cigarettes. These can delay incision healing after surgery. If you need help quitting, ask your health care provider. Contact a health care provider if: You have nausea or vomiting that does not get better with medicine. You vomit every time you eat or drink. You have pain that does not get better with medicine. You cannot urinate or have bloody urine. You develop a skin rash. You have a fever. Get help right away if: You have trouble breathing. You have chest pain. You vomit blood. These symptoms may be an emergency. Get help right away. Call 911. Do not wait to see if the symptoms will go away. Do not drive yourself  to the hospital. Summary After the procedure, it is common to have a sore throat, hoarseness, nausea, vomiting, or to feel weak, sleepy, or fatigue. For the time period you were told by your health care provider, do not drive or use machinery. Get help right away if you have difficulty breathing, have chest pain, or vomit blood. These symptoms may be an emergency. This information is not intended to replace advice given to you by your health care provider. Make sure you discuss any questions you have with your health care provider. Document Revised: 11/09/2021 Document Reviewed: 11/09/2021 Elsevier Patient Education  2024 [employer].How to Use Chlorhexidine at Home in the Shower Chlorhexidine gluconate (CHG) is a germ-killing (antiseptic) wash that's used to clean the skin. It can get rid of the germs that normally live on the skin and can keep them away for about 24 hours. If you're having surgery, you may be told to shower with CHG at home the night before surgery. This can help lower your risk for infection. To use CHG wash in the shower, follow the steps below. Supplies needed: CHG body wash. Clean washcloth. Clean towel. How to use CHG in the shower Follow these steps unless you're told to use CHG in a different way: Start the shower. Use your normal soap and shampoo to wash your face and hair. Turn off the shower or move out of the shower stream. Pour CHG onto a clean washcloth. Do not use any type of brush or rough sponge. Start at your neck, washing your body down to your toes. Make sure you: Wash the part of your body where the surgery will be done for at least 1 minute. Do not scrub. Do not use CHG on your head or face unless your health care provider tells you to. If it gets into your ears or eyes, rinse them well with water . Do not wash your genitals with CHG. Wash your back and under your arms. Make sure to wash skin folds. Let the CHG sit on your skin for 1-2 minutes or as  long as told. Rinse your entire body in the shower, including all body creases and  folds. Turn off the shower. Dry off with a clean towel. Do not put anything on your skin afterward, such as powder, lotion, or perfume. Put on clean clothes or pajamas. If it's the night before surgery, sleep in clean sheets. General tips Use CHG only as told, and follow the instructions on the label. Use the full amount of CHG as told. This is often one bottle. Do not smoke and stay away from flames after using CHG. Your skin may feel sticky after using CHG. This is normal. The sticky feeling will go away as the CHG dries. Do not use CHG: If you have a chlorhexidine allergy or have reacted to chlorhexidine in the past. On open wounds or areas of skin that have broken skin, cuts, or scrapes. On babies younger than 73 months of age. Contact a health care provider if: You have questions about using CHG. Your skin gets irritated or itchy. You have a rash after using CHG. You swallow any CHG. Call your local poison control center [phone] in the [place] Your eyes itch badly, or they become very red or swollen. Your hearing changes. You have trouble seeing. If you can't reach your provider, go to an urgent care or emergency room. Do not drive yourself. Get help right away if: You have swelling or tingling in your mouth or throat. You make high-pitched whistling sounds when you breathe, most often when you breathe out (wheeze). You have trouble breathing. These symptoms may be an emergency. Call 911 right away. Do not wait to see if the symptoms will go away. Do not drive yourself to the hospital. This information is not intended to replace advice given to you by your health care provider. Make sure you discuss any questions you have with your health care provider. Document Revised: 02/25/2023 Document Reviewed: 02/21/2022 Elsevier Patient Education  2024 [employer].

## 2024-05-21 ENCOUNTER — Encounter (HOSPITAL_COMMUNITY)
Admission: RE | Admit: 2024-05-21 | Discharge: 2024-05-21 | Disposition: A | Discharge status: Home / Self Care | Source: Ambulatory Visit | Attending: General Surgery | Admitting: General Surgery

## 2024-05-21 ENCOUNTER — Encounter (HOSPITAL_COMMUNITY): Payer: Self-pay

## 2024-05-21 VITALS — BP 122/80 | HR 65 | Resp 18 | Ht 69.0 in | Wt 222.0 lb

## 2024-05-21 DIAGNOSIS — E1165 Type 2 diabetes mellitus with hyperglycemia: Secondary | ICD-10-CM

## 2024-05-21 DIAGNOSIS — Z7984 Long term (current) use of oral hypoglycemic drugs: Secondary | ICD-10-CM | POA: Diagnosis not present

## 2024-05-21 DIAGNOSIS — Z01818 Encounter for other preprocedural examination: Secondary | ICD-10-CM | POA: Insufficient documentation

## 2024-05-21 DIAGNOSIS — K219 Gastro-esophageal reflux disease without esophagitis: Secondary | ICD-10-CM | POA: Diagnosis not present

## 2024-05-21 DIAGNOSIS — Z8711 Personal history of peptic ulcer disease: Secondary | ICD-10-CM | POA: Diagnosis not present

## 2024-05-21 DIAGNOSIS — E119 Type 2 diabetes mellitus without complications: Secondary | ICD-10-CM | POA: Diagnosis not present

## 2024-05-21 DIAGNOSIS — K429 Umbilical hernia without obstruction or gangrene: Secondary | ICD-10-CM | POA: Diagnosis not present

## 2024-05-21 DIAGNOSIS — R0789 Other chest pain: Secondary | ICD-10-CM | POA: Diagnosis not present

## 2024-05-21 HISTORY — DX: Type 2 diabetes mellitus without complications: E11.9

## 2024-05-21 LAB — HEMOGLOBIN A1C
Hgb A1c MFr Bld: 6.4 % — ABNORMAL HIGH (ref 4.8–5.6)
Mean Plasma Glucose: 136.98 mg/dL

## 2024-05-24 ENCOUNTER — Other Ambulatory Visit: Payer: Self-pay

## 2024-05-24 ENCOUNTER — Encounter (HOSPITAL_COMMUNITY): Payer: Self-pay | Admitting: General Surgery

## 2024-05-24 ENCOUNTER — Ambulatory Visit (HOSPITAL_COMMUNITY): Admitting: Anesthesiology

## 2024-05-24 ENCOUNTER — Ambulatory Visit (HOSPITAL_COMMUNITY)
Admission: RE | Admit: 2024-05-24 | Discharge: 2024-05-24 | Disposition: A | Discharge status: Home / Self Care | Attending: General Surgery | Admitting: General Surgery

## 2024-05-24 ENCOUNTER — Encounter (HOSPITAL_COMMUNITY)
Admission: RE | Disposition: A | Discharge status: Home / Self Care | Payer: Self-pay | Source: Home / Self Care | Attending: General Surgery

## 2024-05-24 DIAGNOSIS — Z8711 Personal history of peptic ulcer disease: Secondary | ICD-10-CM | POA: Insufficient documentation

## 2024-05-24 DIAGNOSIS — E119 Type 2 diabetes mellitus without complications: Secondary | ICD-10-CM | POA: Insufficient documentation

## 2024-05-24 DIAGNOSIS — K219 Gastro-esophageal reflux disease without esophagitis: Secondary | ICD-10-CM | POA: Diagnosis not present

## 2024-05-24 DIAGNOSIS — E1165 Type 2 diabetes mellitus with hyperglycemia: Secondary | ICD-10-CM

## 2024-05-24 DIAGNOSIS — Z7984 Long term (current) use of oral hypoglycemic drugs: Secondary | ICD-10-CM | POA: Insufficient documentation

## 2024-05-24 DIAGNOSIS — R0789 Other chest pain: Secondary | ICD-10-CM | POA: Insufficient documentation

## 2024-05-24 DIAGNOSIS — K429 Umbilical hernia without obstruction or gangrene: Secondary | ICD-10-CM | POA: Diagnosis not present

## 2024-05-24 LAB — GLUCOSE, CAPILLARY
Glucose-Capillary: 140 mg/dL — ABNORMAL HIGH (ref 70–99)
Glucose-Capillary: 198 mg/dL — ABNORMAL HIGH (ref 70–99)

## 2024-05-24 SURGERY — REPAIR, HERNIA, UMBILICAL, ROBOT-ASSISTED
Anesthesia: General | Site: Abdomen

## 2024-05-24 MED ORDER — MIDAZOLAM HCL 2 MG/2ML IJ SOLN
INTRAMUSCULAR | Status: DC | PRN
  Administered 2024-05-24: 2 mg via INTRAVENOUS

## 2024-05-24 MED ORDER — HYDROMORPHONE HCL 1 MG/ML IJ SOLN
INTRAMUSCULAR | Status: AC
  Filled 2024-05-24: qty 0.5

## 2024-05-24 MED ORDER — VANCOMYCIN HCL IN DEXTROSE 1-5 GM/200ML-% IV SOLN
1000.0000 mg | INTRAVENOUS | Status: AC
  Administered 2024-05-24: 1000 mg via INTRAVENOUS
  Filled 2024-05-24: qty 200

## 2024-05-24 MED ORDER — SODIUM CHLORIDE 0.9 % IV SOLN
12.5000 mg | INTRAVENOUS | Status: DC | PRN
  Administered 2024-05-24: 12.5 mg via INTRAVENOUS
  Filled 2024-05-24: qty 0.5

## 2024-05-24 MED ORDER — OXYCODONE HCL 5 MG PO TABS
5.0000 mg | ORAL_TABLET | Freq: Once | ORAL | Status: AC | PRN
  Administered 2024-05-24: 5 mg via ORAL
  Filled 2024-05-24: qty 1

## 2024-05-24 MED ORDER — OXYCODONE HCL 5 MG/5ML PO SOLN: 5.0000 mg | Freq: Once | ORAL | Status: AC | PRN

## 2024-05-24 MED ORDER — DEXMEDETOMIDINE HCL IN NACL 80 MCG/20ML IV SOLN
INTRAVENOUS | Status: DC | PRN
Start: 2024-05-24 — End: 2024-05-24
  Administered 2024-05-24: 6 ug via INTRAVENOUS

## 2024-05-24 MED ORDER — BUPIVACAINE HCL (PF) 0.5 % IJ SOLN
INTRAMUSCULAR | Status: AC
  Filled 2024-05-24: qty 30

## 2024-05-24 MED ORDER — KETOROLAC TROMETHAMINE 30 MG/ML IJ SOLN
30.0000 mg | Freq: Once | INTRAMUSCULAR | Status: AC
Start: 2024-05-24 — End: 2024-05-24
  Administered 2024-05-24: 30 mg via INTRAVENOUS
  Filled 2024-05-24: qty 1

## 2024-05-24 MED ORDER — ACETAMINOPHEN 500 MG PO TABS: 1000.0000 mg | ORAL_TABLET | Freq: Once | ORAL | Status: DC

## 2024-05-24 MED ORDER — ONDANSETRON HCL 4 MG/2ML IJ SOLN
INTRAMUSCULAR | Status: DC | PRN
Start: 2024-05-24 — End: 2024-05-24
  Administered 2024-05-24: 4 mg via INTRAVENOUS

## 2024-05-24 MED ORDER — ACETAMINOPHEN 160 MG/5ML PO SOLN
960.0000 mg | Freq: Once | ORAL | Status: DC
Start: 2024-05-24 — End: 2024-05-24
  Filled 2024-05-24: qty 30

## 2024-05-24 MED ORDER — ROCURONIUM BROMIDE 10 MG/ML (PF) SYRINGE
PREFILLED_SYRINGE | INTRAVENOUS | Status: DC | PRN
Start: 2024-05-24 — End: 2024-05-24
  Administered 2024-05-24: 20 mg via INTRAVENOUS
  Administered 2024-05-24: 60 mg via INTRAVENOUS

## 2024-05-24 MED ORDER — HYDROMORPHONE HCL 1 MG/ML IJ SOLN
INTRAMUSCULAR | Status: DC | PRN
  Administered 2024-05-24 (×2): .5 mg via INTRAVENOUS

## 2024-05-24 MED ORDER — LACTATED RINGERS IV SOLN: INTRAVENOUS | Status: DC

## 2024-05-24 MED ORDER — CHLORHEXIDINE GLUCONATE CLOTH 2 % EX PADS: 6.0000 | MEDICATED_PAD | Freq: Once | CUTANEOUS | Status: DC

## 2024-05-24 MED ORDER — MIDAZOLAM HCL 2 MG/2ML IJ SOLN
INTRAMUSCULAR | Status: AC
  Filled 2024-05-24: qty 2

## 2024-05-24 MED ORDER — STERILE WATER FOR IRRIGATION IR SOLN
Status: DC | PRN
  Administered 2024-05-24: 1000 mL

## 2024-05-24 MED ORDER — HYDROCODONE-ACETAMINOPHEN 5-325 MG PO TABS: 1.0000 | ORAL_TABLET | ORAL | 0 refills | Status: DC | PRN

## 2024-05-24 MED ORDER — BUPIVACAINE HCL (PF) 0.5 % IJ SOLN
INTRAMUSCULAR | Status: DC | PRN
  Administered 2024-05-24: 30 mL

## 2024-05-24 MED ORDER — FENTANYL CITRATE (PF) 100 MCG/2ML IJ SOLN
INTRAMUSCULAR | Status: AC
  Filled 2024-05-24: qty 2

## 2024-05-24 MED ORDER — HYDROMORPHONE HCL 1 MG/ML IJ SOLN
0.2500 mg | INTRAMUSCULAR | Status: DC | PRN
  Administered 2024-05-24: 0.5 mg via INTRAVENOUS
  Filled 2024-05-24: qty 0.5

## 2024-05-24 MED ORDER — CHLORHEXIDINE GLUCONATE 0.12 % MT SOLN
15.0000 mL | Freq: Once | OROMUCOSAL | Status: AC
  Administered 2024-05-24: 15 mL via OROMUCOSAL
  Filled 2024-05-24: qty 15

## 2024-05-24 MED ORDER — FENTANYL CITRATE (PF) 100 MCG/2ML IJ SOLN
INTRAMUSCULAR | Status: DC | PRN
  Administered 2024-05-24: 50 ug via INTRAVENOUS
  Administered 2024-05-24: 100 ug via INTRAVENOUS
  Administered 2024-05-24 (×3): 50 ug via INTRAVENOUS

## 2024-05-24 MED ORDER — SUGAMMADEX SODIUM 200 MG/2ML IV SOLN
INTRAVENOUS | Status: DC | PRN
Start: 2024-05-24 — End: 2024-05-24
  Administered 2024-05-24: 200 mg via INTRAVENOUS

## 2024-05-24 MED ORDER — LIDOCAINE 2% (20 MG/ML) 5 ML SYRINGE
INTRAMUSCULAR | Status: DC | PRN
  Administered 2024-05-24: 60 mg via INTRAVENOUS

## 2024-05-24 MED ORDER — ACETAMINOPHEN 10 MG/ML IV SOLN
INTRAVENOUS | Status: DC | PRN
  Administered 2024-05-24: 1000 mg via INTRAVENOUS

## 2024-05-24 MED ORDER — PROPOFOL 500 MG/50ML IV EMUL
INTRAVENOUS | Status: DC | PRN
  Administered 2024-05-24: 200 mg via INTRAVENOUS

## 2024-05-24 MED ORDER — ACETAMINOPHEN 10 MG/ML IV SOLN
INTRAVENOUS | Status: AC
Start: 2024-05-24 — End: 2024-05-24
  Filled 2024-05-24: qty 100

## 2024-05-24 MED ORDER — DEXAMETHASONE SODIUM PHOSPHATE 10 MG/ML IJ SOLN
INTRAMUSCULAR | Status: DC | PRN
Start: 2024-05-24 — End: 2024-05-24
  Administered 2024-05-24: 5 mg via INTRAVENOUS

## 2024-05-24 SURGICAL SUPPLY — 33 items
CHLORAPREP W/TINT 26 (MISCELLANEOUS) ×1 IMPLANT
COVER LIGHT HANDLE (MISCELLANEOUS) IMPLANT
COVER MAYO STAND XLG (MISCELLANEOUS) ×1 IMPLANT
COVER TIP SHEARS 8 DVNC (MISCELLANEOUS) ×1 IMPLANT
DERMABOND ADVANCED .7 DNX12 (GAUZE/BANDAGES/DRESSINGS) ×1 IMPLANT
DRAPE ARM DVNC X/XI (DISPOSABLE) ×3 IMPLANT
DRAPE COLUMN DVNC XI (DISPOSABLE) ×1 IMPLANT
DRIVER NDL MEGA SUTCUT DVNCXI (INSTRUMENTS) ×1 IMPLANT
DRIVER NDLE MEGA SUTCUT DVNCXI (INSTRUMENTS) ×1 IMPLANT
ELECTRODE REM PT RTRN 9FT ADLT (ELECTROSURGICAL) ×1 IMPLANT
FORCEPS BPLR R/ABLATION 8 DVNC (INSTRUMENTS) ×1 IMPLANT
GLOVE BIOGEL PI IND STRL 7.0 (GLOVE) ×3 IMPLANT
GLOVE SURG SS PI 7.5 STRL IVOR (GLOVE) ×2 IMPLANT
GOWN STRL REUS W/TWL LRG LVL3 (GOWN DISPOSABLE) ×3 IMPLANT
MANIFOLD NEPTUNE II (INSTRUMENTS) ×1 IMPLANT
MESH VENTRALIGHT ST 4.5IN (Mesh General) IMPLANT
NDL HYPO 21X1.5 SAFETY (NEEDLE) ×1 IMPLANT
NDL INSUFFLATION 14GA 120MM (NEEDLE) ×1 IMPLANT
NEEDLE HYPO 21X1.5 SAFETY (NEEDLE) ×1 IMPLANT
NEEDLE INSUFFLATION 14GA 120MM (NEEDLE) ×1 IMPLANT
OBTURATOR OPTICALSTD 8 DVNC (TROCAR) ×1 IMPLANT
PACK LAP CHOLE LZT030E (CUSTOM PROCEDURE TRAY) ×1 IMPLANT
PENCIL HANDSWITCHING (ELECTRODE) ×1 IMPLANT
POSITIONER HEAD 8X9X4 ADT (SOFTGOODS) ×1 IMPLANT
SCISSORS MNPLR CVD DVNC XI (INSTRUMENTS) ×1 IMPLANT
SEAL UNIV 5-12 XI (MISCELLANEOUS) ×3 IMPLANT
SET TUBE SMOKE EVAC HIGH FLOW (TUBING) ×1 IMPLANT
SUT MNCRL AB 4-0 PS2 18 (SUTURE) ×2 IMPLANT
SUT STRATA 3-0 SH (SUTURE) ×2 IMPLANT
SUTURE STRATFX 0 PDS+ CT-2 23 (SUTURE) ×1 IMPLANT
SYR 30ML LL (SYRINGE) ×1 IMPLANT
TAPE TRANSPORE STRL 2 31045 (GAUZE/BANDAGES/DRESSINGS) ×1 IMPLANT
WATER STERILE IRR 500ML POUR (IV SOLUTION) ×1 IMPLANT

## 2024-05-24 NOTE — Anesthesia Procedure Notes (Signed)
 Procedure Name: Intubation Date/Time: 05/24/2024 1:13 PM  Performed by: Elaine Delon CROME, CRNAPre-anesthesia Checklist: Patient identified, Emergency Drugs available, Suction available and Patient being monitored Patient Re-evaluated:Patient Re-evaluated prior to induction Oxygen Delivery Method: Circle system utilized Preoxygenation: Pre-oxygenation with 100% oxygen Induction Type: IV induction Ventilation: Mask ventilation without difficulty Laryngoscope Size: Glidescope and 3 Grade View: Grade I Tube type: Oral Tube size: 7.5 mm Number of attempts: 1 Airway Equipment and Method: Stylet Placement Confirmation: ETT inserted through vocal cords under direct vision, positive ETCO2 and breath sounds checked- equal and bilateral Secured at: 22 cm Tube secured with: Tape Dental Injury: Teeth and Oropharynx as per pre-operative assessment

## 2024-05-24 NOTE — Transfer of Care (Signed)
 Immediate Anesthesia Transfer of Care Note  Patient: Andrew Shields  Procedure(s) Performed: REPAIR, HERNIA, UMBILICAL, ROBOT-ASSISTED (Abdomen)  Patient Location: PACU  Anesthesia Type:General  Level of Consciousness: drowsy and patient cooperative  Airway & Oxygen Therapy: Patient Spontanous Breathing and Patient connected to nasal cannula oxygen  Post-op Assessment: Report given to RN and Post -op Vital signs reviewed and stable  Post vital signs: Reviewed and stable  Last Vitals:  Vitals Value Taken Time  BP 121/79 05/24/24 14:39  Temp 36.9 C 05/24/24 14:39  Pulse 55 05/24/24 14:42  Resp 10 05/24/24 14:42  SpO2 91 % 05/24/24 14:42  Vitals shown include unfiled device data.  Last Pain:  Vitals:   05/24/24 0951  TempSrc: Oral  PainSc: 0-No pain      Patients Stated Pain Goal: 3 (05/24/24 0951)  Complications: No notable events documented.

## 2024-05-24 NOTE — Anesthesia Postprocedure Evaluation (Signed)
 Anesthesia Post Note  Patient: Andrew Shields  Procedure(s) Performed: REPAIR, HERNIA, UMBILICAL, ROBOT-ASSISTED (Abdomen)  Patient location during evaluation: PACU Anesthesia Type: General Level of consciousness: awake and alert Pain management: pain level controlled Vital Signs Assessment: post-procedure vital signs reviewed and stable Respiratory status: spontaneous breathing, nonlabored ventilation and respiratory function stable Cardiovascular status: blood pressure returned to baseline and stable Postop Assessment: no apparent nausea or vomiting Anesthetic complications: no   There were no known notable events for this encounter.   Last Vitals:  Vitals:   05/24/24 1522 05/24/24 1530  BP:  110/70  Pulse: 63 62  Resp: (!) 8 12  Temp:    SpO2: 92% 92%    Last Pain:  Vitals:   05/24/24 1534  TempSrc:   PainSc: 7                  Zared Knoth L Addiel Mccardle

## 2024-05-24 NOTE — Discharge Instructions (Signed)

## 2024-05-24 NOTE — Anesthesia Preprocedure Evaluation (Signed)
 Anesthesia Evaluation  Patient identified by MRN, date of birth, ID band Patient awake    Reviewed: Allergy & Precautions, NPO status , Patient's Chart, lab work & pertinent test results  History of Anesthesia Complications Negative for: history of anesthetic complications  Airway Mallampati: III  TM Distance: >3 FB Neck ROM: Full    Dental no notable dental hx. (+) Dental Advisory Given, Teeth Intact   Pulmonary neg pulmonary ROS   Pulmonary exam normal breath sounds clear to auscultation       Cardiovascular Exercise Tolerance: Good + angina (atypical chest pain)  Normal cardiovascular exam Rhythm:Regular Rate:Normal     Neuro/Psych History right foot drop  Neuromuscular disease  negative psych ROS   GI/Hepatic Neg liver ROS, PUD,GERD (dysphagia )  ,,  Endo/Other  diabetes, Type 2    Renal/GU negative Renal ROS  negative genitourinary   Musculoskeletal Arthrosis of right acromioclavicular joint   Abdominal   Peds negative pediatric ROS (+)  Hematology negative hematology ROS (+)   Anesthesia Other Findings   Reproductive/Obstetrics negative OB ROS                              Anesthesia Physical Anesthesia Plan  ASA: 2  Anesthesia Plan: General   Post-op Pain Management: Minimal or no pain anticipated   Induction: Intravenous  PONV Risk Score and Plan: Ondansetron , Dexamethasone and Midazolam   Airway Management Planned: Oral ETT  Additional Equipment: None  Intra-op Plan:   Post-operative Plan: Extubation in OR  Informed Consent: I have reviewed the patients History and Physical, chart, labs and discussed the procedure including the risks, benefits and alternatives for the proposed anesthesia with the patient or authorized representative who has indicated his/her understanding and acceptance.     Dental advisory given  Plan Discussed with: CRNA  Anesthesia Plan  Comments:          Anesthesia Quick Evaluation

## 2024-05-24 NOTE — Interval H&P Note (Signed)
 History and Physical Interval Note:  05/24/2024 11:23 AM  Andrew Shields  has presented today for surgery, with the diagnosis of HERNIA, UMBILICAL 1- 3 CM.  The various methods of treatment have been discussed with the patient and family. After consideration of risks, benefits and other options for treatment, the patient has consented to  Procedure(s): REPAIR, HERNIA, UMBILICAL, ROBOT-ASSISTED (N/A) as a surgical intervention.  The patient's history has been reviewed, patient examined, no change in status, stable for surgery.  I have reviewed the patient's chart and labs.  Questions were answered to the patient's satisfaction.     [name]

## 2024-05-24 NOTE — Op Note (Signed)
 Patient:  Andrew Shields  DOB:  1966-06-18  MRN:  [id number]    Preop Diagnosis: Umbilical hernia  Postop Diagnosis: Same  Procedure: Robotic assisted laparoscopic umbilical herniorrhaphy with mesh  Surgeon: [name], MD  Anes: General endotracheal  Indications: Patient is a 58 year old white male who presents with a symptomatic umbilical hernia.  The risks and benefits of the procedure including bleeding, infection, mesh use, and the possibility of recurrence of the hernia were fully explained to the patient, who gave informed consent.  Procedure note: The patient was placed in the supine position.  After induction of general endotracheal anesthesia, the abdomen was prepped and draped using usual sterile technique with ChloraPrep.  Surgical site confirmation was performed.  An incision was made in the left upper quadrant at Palmer's point.  A Veress needle was introduced into the abdominal cavity and confirmation of placement was done using the saline drop test.  The abdomen was then insufflated to 15 mmHg pressure.  An 8 mm trocar was introduced into the abdominal cavity under direct visualization without difficulty.  Additional 8 mm trocars were placed in the left flank and left lower quadrant regions.  The robot was then docked and targeted.  The patient had a 2 cm umbilical hernia.  The adipose tissue within the hernia sac was reduced and the hernia sac excised.  It was removed from the abdominal cavity and disposed of.  The fascia was then reapproximated using a 0 Stratafix running suture.  An 11 cm round Bard Ventralight dual mesh was then secured to the anterior abdominal wall using a 3-0 Stratafix running suture.  The robot was undocked and all air was evacuated from the abdominal cavity prior to the removal of the trocars.  All wounds were irrigated with normal saline.  All wounds were injected with 0.5% Sensorcaine.  All incisions were closed using a 4-0 Monocryl subcuticular  suture.  Dermabond was applied.  All tape and needle counts were correct at the end of the procedure.  The patient was extubated in the operating room and transferred to PACU in stable condition.  Complications: None  EBL: Minimal  Specimen: None

## 2024-05-28 NOTE — Progress Notes (Signed)
 BAIRON KLEMANN                                          MRN: [id number]   05/28/2024   The [facility] Quality Team Specialist reviewed this patient medical record for the purposes of chart review for care gap closure. The following were reviewed: chart review for care gap closure-kidney health evaluation for diabetes:eGFR  and uACR.    [facility] Quality Team

## 2024-06-01 ENCOUNTER — Encounter: Payer: Self-pay | Admitting: General Surgery

## 2024-06-01 ENCOUNTER — Ambulatory Visit: Admitting: General Surgery

## 2024-06-01 VITALS — BP 116/82 | HR 72 | Temp 98.0°F | Resp 14 | Ht 69.0 in | Wt 219.0 lb

## 2024-06-01 DIAGNOSIS — Z09 Encounter for follow-up examination after completed treatment for conditions other than malignant neoplasm: Secondary | ICD-10-CM

## 2024-06-02 NOTE — Progress Notes (Signed)
 Subjective:     Andrew Shields  Patient here for postoperative visit, status post robotic assisted laparoscopic umbilical herniorrhaphy with mesh.  He states he is doing very well.  He denies any significant abdominal pain.  He only took 1 pain pill.  He has been limiting his heavy lifting. Objective:    BP 116/82   Pulse 72   Temp 98 F (36.7 C) (Oral)   Resp 14   Ht 5' 9 (1.753 m)   Wt 219 lb (99.3 kg)   SpO2 98%   BMI 32.34 kg/m   General:  alert, cooperative, and no distress  Abdomen soft, incisions healing well.     Assessment:    Doing well postoperatively.    Plan:   May gradually increase activity as able.  Follow-up here as needed.

## 2024-07-14 ENCOUNTER — Ambulatory Visit (INDEPENDENT_AMBULATORY_CARE_PROVIDER_SITE_OTHER): Admitting: Family Medicine

## 2024-07-14 ENCOUNTER — Encounter: Payer: Self-pay | Admitting: Family Medicine

## 2024-07-14 VITALS — BP 104/80 | HR 68 | Temp 98.1°F | Ht 66.54 in | Wt 219.6 lb

## 2024-07-14 DIAGNOSIS — E1165 Type 2 diabetes mellitus with hyperglycemia: Secondary | ICD-10-CM

## 2024-07-14 DIAGNOSIS — Z0000 Encounter for general adult medical examination without abnormal findings: Secondary | ICD-10-CM

## 2024-07-14 DIAGNOSIS — Z23 Encounter for immunization: Secondary | ICD-10-CM | POA: Diagnosis not present

## 2024-07-14 DIAGNOSIS — E785 Hyperlipidemia, unspecified: Secondary | ICD-10-CM | POA: Diagnosis not present

## 2024-07-14 LAB — MICROALBUMIN / CREATININE URINE RATIO
Creatinine,U: 119.5 mg/dL
Microalb Creat Ratio: UNDETERMINED mg/g (ref 0.0–30.0)
Microalb, Ur: <0.7 mg/dL

## 2024-07-14 LAB — CBC WITH DIFFERENTIAL/PLATELET
Basophils Absolute: 0 K/uL (ref 0.0–0.1)
Basophils Relative: 0.7 % (ref 0.0–3.0)
Eosinophils Absolute: 0.1 K/uL (ref 0.0–0.7)
Eosinophils Relative: 2 % (ref 0.0–5.0)
HCT: 44.7 % (ref 39.0–52.0)
Hemoglobin: 15.2 g/dL (ref 13.0–17.0)
Lymphocytes Relative: 45.2 % (ref 12.0–46.0)
Lymphs Abs: 2.5 K/uL (ref 0.7–4.0)
MCHC: 33.9 g/dL (ref 30.0–36.0)
MCV: 91.4 fl (ref 78.0–100.0)
Monocytes Absolute: 0.5 K/uL (ref 0.1–1.0)
Monocytes Relative: 8.7 % (ref 3.0–12.0)
Neutro Abs: 2.4 K/uL (ref 1.4–7.7)
Neutrophils Relative %: 43.4 % (ref 43.0–77.0)
Platelets: 232 K/uL (ref 150.0–400.0)
RBC: 4.89 Mil/uL (ref 4.22–5.81)
RDW: 13.5 % (ref 11.5–15.5)
WBC: 5.6 K/uL (ref 4.0–10.5)

## 2024-07-14 LAB — BASIC METABOLIC PANEL WITH GFR
BUN: 13 mg/dL (ref 6–23)
CO2: 27 meq/L (ref 19–32)
Calcium: 9.5 mg/dL (ref 8.4–10.5)
Chloride: 102 meq/L (ref 96–112)
Creatinine, Ser: 0.93 mg/dL (ref 0.40–1.50)
GFR: 89.55 mL/min (ref >60.00)
Glucose, Bld: 133 mg/dL — ABNORMAL HIGH (ref 70–99)
Potassium: 4.5 meq/L (ref 3.5–5.1)
Sodium: 139 meq/L (ref 135–145)

## 2024-07-14 LAB — LIPID PANEL
Cholesterol: 169 mg/dL (ref 0–200)
HDL: 42.7 mg/dL (ref >39.00)
LDL Cholesterol: 49 mg/dL (ref 0–99)
NonHDL: 126.75
Total CHOL/HDL Ratio: 4
Triglycerides: 391 mg/dL — ABNORMAL HIGH (ref 0.0–149.0)
VLDL: 78.2 mg/dL — ABNORMAL HIGH (ref 0.0–40.0)

## 2024-07-14 LAB — HEPATIC FUNCTION PANEL
ALT: 22 U/L (ref 0–53)
AST: 21 U/L (ref 0–37)
Albumin: 4.8 g/dL (ref 3.5–5.2)
Alkaline Phosphatase: 58 U/L (ref 39–117)
Bilirubin, Direct: 0.2 mg/dL (ref 0.0–0.3)
Total Bilirubin: 0.8 mg/dL (ref 0.2–1.2)
Total Protein: 7.3 g/dL (ref 6.0–8.3)

## 2024-07-14 LAB — HEMOGLOBIN A1C: Hgb A1c MFr Bld: 6.6 % — ABNORMAL HIGH (ref 4.6–6.5)

## 2024-07-14 LAB — PSA: PSA: 0.33 ng/mL (ref 0.10–4.00)

## 2024-07-14 NOTE — Progress Notes (Signed)
 Established Patient Office Visit  Subjective   Patient ID: Andrew Shields, male    DOB: 1966-06-03  Age: 58 y.o. MRN: [id number]  Chief Complaint  Patient presents with   Annual Exam    HPI   Andrew Shields is seen today for physical exam.  He has history of GERD, type 2 diabetes, chronic bilateral sensorineural hearing loss, dyslipidemia, recent umbilical hernia repair.  He had some increasing symptoms from his umbilical hernia and went into actually ER and had CT scan which showed no acute findings.  Was scheduled promptly for surgery early the next week.  Has healed uneventfully and has had no evidence for recurrence.  Very pleased with his surgery.  He does have family history of CAD in his mother in her 12s.  She was also diabetic.  [name] would like to consider coronary artery disease screening through coronary CT which we discussed previously  Health maintenance reviewed:  -Colonoscopy up-to-date.  He does need these every 5 years because of family history of colon cancer in a first-degree relative -Needs flu vaccine and pneumonia vaccine and consents to both -Shingrix  already given  Social history-he is married.  His wife retired couple of years ago.    they have no children.  Non-smoker.  No alcohol.  Stays very involved with church.   Family history-mother had colon cancer in his father had lung cancer.  His mother had history of heart disease and type 2 diabetes.  Father with history of alcohol abuse.  He has a sister with type 2 diabetes.  Also has a brother is had complications of type 2 diabetes  Past Medical History:  Diagnosis Date   Diabetes (HCC)    Hyperlipidemia    Past Surgical History:  Procedure Laterality Date   CARPAL TUNNEL RELEASE Right    COLONOSCOPY  04/19/2010   RMR: 1. normal rectum 2. normal colon. 3. Normal terminal ileum    COLONOSCOPY N/A 06/06/2015   Dr. Charley. Surveillance in 5 years    COLONOSCOPY WITH PROPOFOL  N/A 10/13/2020   Procedure:  COLONOSCOPY WITH PROPOFOL ;  Surgeon: Shaaron Lamar HERO, MD;  Location: [facility];  Service: Endoscopy;  Laterality: N/A;  11:00am, covid + 1/18 <90 days   ESOPHAGEAL DILATION N/A 06/06/2015   Procedure: ESOPHAGEAL DILATION;  Surgeon: Lamar HERO Shaaron, MD;  Location: [facility];  Service: Endoscopy;  Laterality: N/A;   ESOPHAGOGASTRODUODENOSCOPY N/A 06/06/2015   Dr. Rourk:mild erosive reflux/HH/s/p empiric dilation   TONSILLECTOMY      reports that he has never smoked. He has never used smokeless tobacco. He reports that he does not drink alcohol and does not use drugs. family history includes Alcohol abuse in his father; Cancer in his mother; Cancer (age of onset: 61) in his father; Colon cancer in his mother; Diabetes in his mother and sister; Heart disease (age of onset: 40) in his mother; Hyperlipidemia in his brother. Allergies  Allergen Reactions   Amoxicillin Shortness Of Breath   Amoxicillin-Pot Clavulanate Shortness Of Breath and Other (See Comments)    difficulty breathing   The 10-year ASCVD risk score (Arnett DK, et al., 2019) is: 11.1%   Values used to calculate the score:     Age: 70 years     Clincally relevant sex: Male     Is Non-Hispanic African American: No     Diabetic: Yes     Tobacco smoker: No     Systolic Blood Pressure: 104 mmHg     Is BP  treated: No     HDL Cholesterol: 40.3 mg/dL     Total Cholesterol: 165 mg/dL   Review of Systems  Constitutional:  Negative for chills, fever, malaise/fatigue and weight loss.  HENT:  Negative for hearing loss.   Eyes:  Negative for blurred vision and double vision.  Respiratory:  Negative for cough and shortness of breath.   Cardiovascular:  Negative for chest pain, palpitations and leg swelling.  Gastrointestinal:  Negative for abdominal pain, blood in stool, constipation and diarrhea.  Genitourinary:  Negative for dysuria.  Skin:  Negative for rash.  Neurological:  Negative for dizziness, speech change, seizures,  loss of consciousness and headaches.  Psychiatric/Behavioral:  Negative for depression.       Objective:     BP 104/80   Pulse 68   Temp 98.1 F (36.7 C) (Oral)   Ht 5' 6.54 (1.69 m)   Wt 219 lb 9.6 oz (99.6 kg)   SpO2 95%   BMI 34.88 kg/m  BP Readings from Last 3 Encounters:  07/14/24 104/80  06/01/24 116/82  05/24/24 123/85   Wt Readings from Last 3 Encounters:  07/14/24 219 lb 9.6 oz (99.6 kg)  06/01/24 219 lb (99.3 kg)  05/24/24 222 lb (100.7 kg)      Physical Exam Vitals reviewed.  Constitutional:      General: He is not in acute distress.    Appearance: He is well-developed.  HENT:     Head: Normocephalic and atraumatic.     Right Ear: External ear normal.     Left Ear: External ear normal.  Eyes:     Conjunctiva/sclera: Conjunctivae normal.     Pupils: Pupils are equal, round, and reactive to light.  Neck:     Thyroid : No thyromegaly.  Cardiovascular:     Rate and Rhythm: Normal rate and regular rhythm.     Heart sounds: Normal heart sounds. No murmur heard. Pulmonary:     Effort: No respiratory distress.     Breath sounds: No wheezing or rales.  Abdominal:     General: Bowel sounds are normal. There is no distension.     Palpations: Abdomen is soft. There is no mass.     Tenderness: There is no abdominal tenderness. There is no guarding or rebound.     Comments: Small scars from recent surgery.  Well-healed.  No abdominal tenderness.  Musculoskeletal:     Cervical back: Normal range of motion and neck supple.  Lymphadenopathy:     Cervical: No cervical adenopathy.  Skin:    Findings: No rash.  Neurological:     Mental Status: He is alert and oriented to person, place, and time.     Cranial Nerves: No cranial nerve deficit.     Deep Tendon Reflexes: Reflexes normal.      No results found for any visits on 07/14/24.  Last CBC Lab Results  Component Value Date   WBC 6.7 05/14/2024   HGB 15.0 05/14/2024   HCT 45.1 05/14/2024   MCV 92.8  05/14/2024   MCH 30.9 05/14/2024   RDW 12.7 05/14/2024   PLT 238 05/14/2024   Last metabolic panel Lab Results  Component Value Date   GLUCOSE 116 (H) 05/14/2024   NA 138 05/14/2024   K 4.1 05/14/2024   CL 105 05/14/2024   CO2 22 05/14/2024   BUN 14 05/14/2024   CREATININE 0.84 05/14/2024   GFRNONAA >60 05/14/2024   CALCIUM  9.1 05/14/2024   PROT 7.4 05/14/2024   ALBUMIN  4.5 05/14/2024   BILITOT 1.0 05/14/2024   ALKPHOS 58 05/14/2024   AST 27 05/14/2024   ALT 25 05/14/2024   ANIONGAP 11 05/14/2024   Last lipids Lab Results  Component Value Date   CHOL 165 01/12/2024   HDL 40.30 01/12/2024   LDLCALC 64 07/14/2023   LDLDIRECT 78.0 01/12/2024   TRIG (H) 01/12/2024    402.0 Triglyceride is over 400; calculations on Lipids are invalid.   CHOLHDL 4 01/12/2024   Last hemoglobin A1c Lab Results  Component Value Date   HGBA1C 6.4 (H) 05/21/2024      The 10-year ASCVD risk score (Arnett DK, et al., 2019) is: 11.1%    Assessment & Plan:   Problem List Items Addressed This Visit       Unprioritized   Type 2 diabetes mellitus with hyperglycemia (HCC)   Relevant Orders   Hemoglobin A1c   Microalbumin / creatinine urine ratio   Other Visit Diagnoses       Physical exam    -  Primary   Relevant Orders   Basic metabolic panel with GFR   Lipid panel   CBC with Differential/Platelet   Hepatic function panel   PSA   Hemoglobin A1c   Microalbumin / creatinine urine ratio     Hyperlipidemia, unspecified hyperlipidemia type       Relevant Orders   CT CARDIAC SCORING (SELF PAY ONLY)     Patient here today for physical.  He has history of type 2 diabetes which has been fairly well-controlled with last A1c 6.4%.  Does have calculated 10-year risk for CAD 11.1%.  Family history of CAD in mom.  We discussed setting up coronary calcium  score to further risk stratify and patient agrees.  Recommend flu vaccine and Prevnar 20 and patient agrees to both.  Continue every 5-year  colonoscopy screening with family history of colon cancer as above  Patient also due for urine microalbumin with his diabetes history and reminder to continue yearly diabetic eye exam.  No follow-ups on file.    Wolm Scarlet, MD

## 2024-07-15 ENCOUNTER — Ambulatory Visit: Payer: Self-pay | Admitting: Family Medicine

## 2024-07-15 MED ORDER — METFORMIN HCL 500 MG PO TABS: ORAL_TABLET | ORAL | 3 refills | Status: AC

## 2024-07-15 MED ORDER — ROSUVASTATIN CALCIUM 10 MG PO TABS: 10.0000 mg | ORAL_TABLET | Freq: Every day | ORAL | 3 refills | Status: AC

## 2024-07-15 MED ORDER — PANTOPRAZOLE SODIUM 40 MG PO TBEC: 40.0000 mg | DELAYED_RELEASE_TABLET | Freq: Every day | ORAL | 3 refills | Status: AC

## 2024-07-15 MED ORDER — OMEGA-3-ACID ETHYL ESTERS 1 G PO CAPS: ORAL_CAPSULE | ORAL | 3 refills | Status: AC

## 2024-07-15 NOTE — Telephone Encounter (Signed)
 FYI Only or Action Required?: Action required by provider: medication refill request.  Patient was last seen in primary care on 07/14/2024 by Micheal Andrew ORN, Shields.  Called Nurse Triage reporting Medication Question.  Symptoms began n/a.  Interventions attempted: Other: n/a.  Symptoms are: n/a.  Triage Disposition: Call PCP When Office is Open  Patient/caregiver understands and will follow disposition?: Yes Reason for Disposition  [1] Caller has NON-URGENT medicine question about med that PCP prescribed AND [2] triager unable to answer question  Answer Assessment - Initial Assessment Questions Patient states PCP was supposed to send in new orders for all of medications, patient states there is nothing at pharmacy, please advise.   1. QUESTION: What is your question? (e.g., double dose of medicine, side effect)     Patient states at physical yesterday PCP was to send new orders for all of patient's medication to pharmacy and nothing has been called in  2. PRESCRIBER: Who prescribed the medicine? Reason: if prescribed by specialist, call should be referred to that group.     Andrew Shields  Protocols used: Medication Question Call-A-AH  Copied from CRM [id number]. Topic: Clinical - Medication Question >> Jul 15, 2024 11:02 AM Mercedes MATSU wrote: Reason for CRM: Patient called in stating that he had a physical done yesterday and he was prescribed some medications that he would like to go over and has questions about. Patient can be reached at [phone]. He wants to know if his medication has been changed.

## 2024-07-15 NOTE — Telephone Encounter (Signed)
 Rx sent.

## 2024-07-15 NOTE — Addendum Note (Signed)
 Addended by: METTA KRISTEN CROME on: 07/15/2024 11:34 AM   Modules accepted: Orders

## 2024-07-20 ENCOUNTER — Ambulatory Visit (HOSPITAL_BASED_OUTPATIENT_CLINIC_OR_DEPARTMENT_OTHER)
Admission: RE | Admit: 2024-07-20 | Discharge: 2024-07-20 | Disposition: A | Discharge status: Home / Self Care | Payer: Self-pay | Source: Ambulatory Visit | Attending: Family Medicine | Admitting: Family Medicine

## 2024-07-20 DIAGNOSIS — E785 Hyperlipidemia, unspecified: Secondary | ICD-10-CM | POA: Insufficient documentation
